# Patient Record
Sex: Female | Born: 1950
Health system: Southern US, Community
[De-identification: ages and names within clinical notes are randomized; demographics above are authoritative.]

## PROBLEM LIST (undated history)

## (undated) DIAGNOSIS — E049 Nontoxic goiter, unspecified: Secondary | ICD-10-CM

## (undated) DIAGNOSIS — I341 Nonrheumatic mitral (valve) prolapse: Secondary | ICD-10-CM

## (undated) DIAGNOSIS — I200 Unstable angina: Secondary | ICD-10-CM

## (undated) DIAGNOSIS — M542 Cervicalgia: Secondary | ICD-10-CM

## (undated) DIAGNOSIS — I2510 Atherosclerotic heart disease of native coronary artery without angina pectoris: Secondary | ICD-10-CM

## (undated) DIAGNOSIS — I10 Essential (primary) hypertension: Secondary | ICD-10-CM

## (undated) DIAGNOSIS — J45909 Unspecified asthma, uncomplicated: Secondary | ICD-10-CM

## (undated) DIAGNOSIS — Z860100 Personal history of colon polyps, unspecified: Secondary | ICD-10-CM

## (undated) DIAGNOSIS — E785 Hyperlipidemia, unspecified: Secondary | ICD-10-CM

## (undated) DIAGNOSIS — G43909 Migraine, unspecified, not intractable, without status migrainosus: Secondary | ICD-10-CM

## (undated) DIAGNOSIS — Z8679 Personal history of other diseases of the circulatory system: Secondary | ICD-10-CM

## (undated) DIAGNOSIS — E041 Nontoxic single thyroid nodule: Secondary | ICD-10-CM

## (undated) DIAGNOSIS — K219 Gastro-esophageal reflux disease without esophagitis: Secondary | ICD-10-CM

## (undated) DIAGNOSIS — G43009 Migraine without aura, not intractable, without status migrainosus: Secondary | ICD-10-CM

## (undated) DIAGNOSIS — I4510 Unspecified right bundle-branch block: Secondary | ICD-10-CM

## (undated) DIAGNOSIS — N63 Unspecified lump in unspecified breast: Secondary | ICD-10-CM

## (undated) HISTORY — DX: Nontoxic goiter, unspecified: E04.9

## (undated) HISTORY — DX: Migraine without aura, not intractable, without status migrainosus: G43.009

## (undated) HISTORY — DX: Essential (primary) hypertension: I10

## (undated) HISTORY — DX: Unspecified right bundle-branch block: I45.10

## (undated) HISTORY — DX: Hyperlipidemia, unspecified: E78.5

## (undated) HISTORY — DX: Atherosclerotic heart disease of native coronary artery without angina pectoris: I25.10

## (undated) HISTORY — DX: Nonrheumatic mitral (valve) prolapse: I34.1

## (undated) HISTORY — DX: Personal history of colonic polyps: Z86.010

## (undated) HISTORY — DX: Personal history of other diseases of the circulatory system: Z86.79

## (undated) HISTORY — DX: Personal history of colon polyps, unspecified: Z86.0100

## (undated) HISTORY — DX: Unspecified asthma, uncomplicated: J45.909

## (undated) HISTORY — DX: Unstable angina: I20.0

## (undated) HISTORY — DX: Cervicalgia: M54.2

---

## 1999-02-05 ENCOUNTER — Encounter: Payer: Self-pay | Admitting: Family Medicine

## 1999-02-05 ENCOUNTER — Encounter: Admission: RE | Admit: 1999-02-05 | Discharge: 1999-02-05 | Payer: Self-pay | Admitting: Family Medicine

## 1999-08-26 ENCOUNTER — Other Ambulatory Visit: Admission: RE | Admit: 1999-08-26 | Discharge: 1999-08-26 | Payer: Self-pay | Admitting: Obstetrics and Gynecology

## 2000-01-20 ENCOUNTER — Other Ambulatory Visit: Admission: RE | Admit: 2000-01-20 | Discharge: 2000-01-20 | Payer: Self-pay | Admitting: *Deleted

## 2000-10-26 ENCOUNTER — Other Ambulatory Visit: Admission: RE | Admit: 2000-10-26 | Discharge: 2000-10-26 | Payer: Self-pay | Admitting: Obstetrics and Gynecology

## 2000-11-01 ENCOUNTER — Encounter: Payer: Self-pay | Admitting: Family Medicine

## 2000-11-01 ENCOUNTER — Encounter: Admission: RE | Admit: 2000-11-01 | Discharge: 2000-11-01 | Payer: Self-pay | Admitting: Family Medicine

## 2000-11-16 ENCOUNTER — Encounter: Payer: Self-pay | Admitting: Endocrinology

## 2000-11-16 ENCOUNTER — Ambulatory Visit (HOSPITAL_COMMUNITY): Admission: RE | Admit: 2000-11-16 | Discharge: 2000-11-16 | Payer: Self-pay | Admitting: Endocrinology

## 2000-11-30 ENCOUNTER — Other Ambulatory Visit: Admission: RE | Admit: 2000-11-30 | Discharge: 2000-11-30 | Payer: Self-pay | Admitting: Endocrinology

## 2002-06-05 ENCOUNTER — Other Ambulatory Visit: Admission: RE | Admit: 2002-06-05 | Discharge: 2002-06-05 | Payer: Self-pay | Admitting: Obstetrics and Gynecology

## 2002-11-02 ENCOUNTER — Encounter: Payer: Self-pay | Admitting: Endocrinology

## 2002-11-02 ENCOUNTER — Ambulatory Visit (HOSPITAL_COMMUNITY): Admission: RE | Admit: 2002-11-02 | Discharge: 2002-11-02 | Payer: Self-pay | Admitting: Endocrinology

## 2003-08-13 ENCOUNTER — Encounter (INDEPENDENT_AMBULATORY_CARE_PROVIDER_SITE_OTHER): Payer: Self-pay | Admitting: *Deleted

## 2003-08-13 ENCOUNTER — Ambulatory Visit (HOSPITAL_COMMUNITY): Admission: RE | Admit: 2003-08-13 | Discharge: 2003-08-13 | Payer: Self-pay | Admitting: Endocrinology

## 2005-04-16 ENCOUNTER — Ambulatory Visit (HOSPITAL_COMMUNITY): Admission: RE | Admit: 2005-04-16 | Discharge: 2005-04-16 | Payer: Self-pay | Admitting: Endocrinology

## 2005-06-08 ENCOUNTER — Other Ambulatory Visit: Admission: RE | Admit: 2005-06-08 | Discharge: 2005-06-08 | Payer: Self-pay | Admitting: Obstetrics and Gynecology

## 2005-07-06 ENCOUNTER — Encounter: Payer: Self-pay | Admitting: Internal Medicine

## 2005-07-13 ENCOUNTER — Encounter: Payer: Self-pay | Admitting: Internal Medicine

## 2005-07-23 ENCOUNTER — Ambulatory Visit (HOSPITAL_COMMUNITY): Admission: RE | Admit: 2005-07-23 | Discharge: 2005-07-23 | Payer: Self-pay | Admitting: Endocrinology

## 2006-03-09 ENCOUNTER — Ambulatory Visit (HOSPITAL_COMMUNITY): Admission: RE | Admit: 2006-03-09 | Discharge: 2006-03-09 | Payer: Self-pay | Admitting: Endocrinology

## 2006-03-22 ENCOUNTER — Other Ambulatory Visit: Admission: RE | Admit: 2006-03-22 | Discharge: 2006-03-22 | Payer: Self-pay | Admitting: Interventional Radiology

## 2006-03-22 ENCOUNTER — Encounter: Admission: RE | Admit: 2006-03-22 | Discharge: 2006-03-22 | Payer: Self-pay | Admitting: Endocrinology

## 2006-03-22 ENCOUNTER — Encounter (INDEPENDENT_AMBULATORY_CARE_PROVIDER_SITE_OTHER): Payer: Self-pay | Admitting: Specialist

## 2006-09-21 ENCOUNTER — Ambulatory Visit (HOSPITAL_COMMUNITY): Admission: RE | Admit: 2006-09-21 | Discharge: 2006-09-21 | Payer: Self-pay | Admitting: Obstetrics and Gynecology

## 2007-01-07 ENCOUNTER — Encounter: Admission: RE | Admit: 2007-01-07 | Discharge: 2007-01-07 | Payer: Self-pay | Admitting: Family Medicine

## 2007-04-14 HISTORY — PX: HYSTEROSCOPY: SHX211

## 2007-12-14 ENCOUNTER — Ambulatory Visit (HOSPITAL_COMMUNITY): Admission: RE | Admit: 2007-12-14 | Discharge: 2007-12-14 | Payer: Self-pay | Admitting: Obstetrics and Gynecology

## 2007-12-14 ENCOUNTER — Encounter (INDEPENDENT_AMBULATORY_CARE_PROVIDER_SITE_OTHER): Payer: Self-pay | Admitting: Obstetrics and Gynecology

## 2008-04-13 LAB — HM COLONOSCOPY

## 2008-09-26 ENCOUNTER — Encounter: Admission: RE | Admit: 2008-09-26 | Discharge: 2008-09-26 | Payer: Self-pay | Admitting: Internal Medicine

## 2008-11-08 ENCOUNTER — Encounter: Admission: RE | Admit: 2008-11-08 | Discharge: 2008-11-08 | Payer: Self-pay | Admitting: Obstetrics and Gynecology

## 2009-05-28 ENCOUNTER — Emergency Department (HOSPITAL_COMMUNITY)
Admission: EM | Admit: 2009-05-28 | Discharge: 2009-05-28 | Payer: Self-pay | Source: Home / Self Care | Admitting: Emergency Medicine

## 2009-06-11 LAB — HM MAMMOGRAPHY: HM Mammogram: NORMAL

## 2009-06-11 LAB — CONVERTED CEMR LAB: Pap Smear: NORMAL

## 2009-07-16 ENCOUNTER — Ambulatory Visit: Payer: Self-pay | Admitting: Family Medicine

## 2009-08-06 ENCOUNTER — Ambulatory Visit: Payer: Self-pay | Admitting: Internal Medicine

## 2009-08-06 DIAGNOSIS — M542 Cervicalgia: Secondary | ICD-10-CM

## 2009-08-06 DIAGNOSIS — E049 Nontoxic goiter, unspecified: Secondary | ICD-10-CM | POA: Insufficient documentation

## 2009-08-06 DIAGNOSIS — Z8679 Personal history of other diseases of the circulatory system: Secondary | ICD-10-CM | POA: Insufficient documentation

## 2009-08-06 DIAGNOSIS — G43009 Migraine without aura, not intractable, without status migrainosus: Secondary | ICD-10-CM | POA: Insufficient documentation

## 2009-08-06 DIAGNOSIS — E785 Hyperlipidemia, unspecified: Secondary | ICD-10-CM | POA: Insufficient documentation

## 2009-08-06 HISTORY — DX: Migraine without aura, not intractable, without status migrainosus: G43.009

## 2009-08-06 HISTORY — DX: Hyperlipidemia, unspecified: E78.5

## 2009-08-06 HISTORY — DX: Cervicalgia: M54.2

## 2009-09-17 ENCOUNTER — Encounter: Admission: RE | Admit: 2009-09-17 | Discharge: 2009-09-17 | Payer: Self-pay | Admitting: Endocrinology

## 2009-10-02 ENCOUNTER — Encounter: Admission: RE | Admit: 2009-10-02 | Discharge: 2009-10-02 | Payer: Self-pay | Admitting: Endocrinology

## 2009-10-02 ENCOUNTER — Other Ambulatory Visit: Admission: RE | Admit: 2009-10-02 | Discharge: 2009-10-02 | Payer: Self-pay | Admitting: Interventional Radiology

## 2009-10-11 ENCOUNTER — Encounter: Admission: RE | Admit: 2009-10-11 | Discharge: 2009-10-11 | Payer: Self-pay | Admitting: Neurology

## 2009-11-11 ENCOUNTER — Encounter: Admission: RE | Admit: 2009-11-11 | Discharge: 2009-11-11 | Payer: Self-pay | Admitting: Obstetrics and Gynecology

## 2009-12-11 ENCOUNTER — Telehealth: Payer: Self-pay | Admitting: Internal Medicine

## 2010-02-06 ENCOUNTER — Encounter: Admission: RE | Admit: 2010-02-06 | Discharge: 2010-02-06 | Payer: Self-pay | Admitting: Neurology

## 2010-05-03 ENCOUNTER — Encounter: Payer: Self-pay | Admitting: Endocrinology

## 2010-05-04 ENCOUNTER — Encounter: Payer: Self-pay | Admitting: Endocrinology

## 2010-05-12 LAB — CBC
Hemoglobin: 14 g/dL (ref 12.0–15.0)
RBC: 4.55 MIL/uL (ref 3.87–5.11)
WBC: 3.4 10*3/uL — ABNORMAL LOW (ref 4.0–10.5)
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
## 2010-05-13 NOTE — Progress Notes (Signed)
Summary: old records are reviewed  Phone Note Outgoing Call   Summary of Call: records from previous M.D. reviewed TD 10-13-2005 History of chest pain, negative stress test in 2002, re-eval by Dr Fraser Din in 2007 --echocardiogram showed mild prolapse of the anterior mitral valve with trivial MR, normal EF. Normal stress test. Jose E. Paz MD  December 11, 2009 10:57 AM

## 2010-05-13 NOTE — Letter (Signed)
Summary: [facility]  [facility]   Imported By: Sherian Rein 12/20/2009 07:57:45  _____________________________________________________________________  External Attachment:    Type:   Image     Comment:   External Document

## 2010-05-15 ENCOUNTER — Ambulatory Visit (HOSPITAL_COMMUNITY)
Admission: RE | Admit: 2010-05-15 | Discharge: 2010-05-15 | Disposition: A | Discharge status: Home / Self Care | Payer: BC Managed Care – PPO | Source: Ambulatory Visit | Attending: Obstetrics and Gynecology | Admitting: Obstetrics and Gynecology

## 2010-05-15 ENCOUNTER — Other Ambulatory Visit: Payer: Self-pay | Admitting: Obstetrics and Gynecology

## 2010-05-15 DIAGNOSIS — N840 Polyp of corpus uteri: Secondary | ICD-10-CM | POA: Insufficient documentation

## 2010-05-15 DIAGNOSIS — D250 Submucous leiomyoma of uterus: Secondary | ICD-10-CM | POA: Insufficient documentation

## 2010-05-17 NOTE — H&P (Signed)
  NAME:  Christine Walker, Christine Walker NO.:  [id number]  MEDICAL RECORD NO.:  [id number]         PATIENT TYPE:  WAMB  LOCATION:                                FACILITY:  [facility]  PHYSICIAN:  Juluis Mire, M.D.   DATE OF BIRTH:  04/02/1951  DATE OF ADMISSION:  05/15/2010 DATE OF DISCHARGE:                             HISTORY & PHYSICAL   A 60 year old postmenopausal patient presents for hysteroscopy.  The patient had an episode of post-menopausal bleeding.  Underwent a saline infusion ultrasound with finding of several polypoid-like masses seen within the intrauterine cavity plus a subserosal fibroid who now presents for hysteroscopic resection of that.  She had a previous hysteroscopy D and C in 2009.  In terms of allergies, no known drug allergies.  MEDICATIONS:  She takes something for migraine headaches, she was unsure.  PAST MEDICAL HISTORY:  History of migraine headaches.  Otherwise usual childhood diseases.  No significant sequelae.  PAST SURGICAL HISTORY:  Basically was the hysteroscopy with D and C in 2009.  FAMILY HISTORY:  Noncontributory.  SOCIAL HISTORY:  No tobacco or alcohol abuse.  REVIEW OF SYSTEMS:  Noncontributory.  PHYSICAL EXAMINATION:  VITAL SIGNS:  The patient afebrile, stable vital signs. HEENT:  The patient is normocephalic.  Pupils equal, round, and reactive to light and accommodation.  Extraocular movements were intact.  Sclerae and conjunctivae were clear.  Oropharynx clear. NECK:  Without thyromegaly. BREASTS:  No discrete masses. LUNGS:  Clear. CARDIOVASCULAR:  Regular rate.  No murmurs or gallops. ABDOMEN:  Benign.  No mass, organomegaly, or tenderness. PELVIC:  Normal external genitalia.  Vaginal mucosa is clear.  Cervix unremarkable.  Usual size, shape, and contour.  Adnexa free of mass or tenderness. EXTREMITIES:  Trace edema. NEUROLOGIC:  Grossly within normal limits.  IMPRESSION:  Postmenopausal bleeding with finding of  endometrial polyp.  PLAN:  The patient is to undergo hysteroscopic resection.  The risks of surgery have been discussed including the risk of infection.  The risk of hemorrhage could require transfusion with AIDS or hepatitis. Excessive bleeding could require hysterectomy.  There is a risk of perforation leading to injury to adjacent organs, risk of deep venous thrombosis, and pulmonary embolus.  The patient voiced understanding of indications and risks.     Juluis Mire, M.D.     JSM/MEDQ  D:  05/15/2010  T:  05/15/2010  Job:  [id number]  Electronically Signed by Richardean Chimera M.D. on 05/16/2010 01:44:30 PM

## 2010-05-17 NOTE — Op Note (Signed)
  NAME:  Christine Walker, Christine Walker             ACCOUNT NO.:  [id number]  MEDICAL RECORD NO.:  [id number]           PATIENT TYPE:  O  LOCATION:  [facility]                          [facility]ACILITY:  [facility]  PHYSICIAN:  Juluis Mire, M.D.   DATE OF BIRTH:  02-08-1951  DATE OF PROCEDURE:  05/15/2010 DATE OF DISCHARGE:                              OPERATIVE REPORT   PREOPERATIVE DIAGNOSIS:  Endometrial polyp.  POSTOPERATIVE DIAGNOSIS:  Submucosal fibroid.  PROCEDURE:  Paracervical block.  Hysteroscopy, resection of submucosal fibroids.  Endometrial biopsies and curettings.  SURGEON:  Juluis Mire, M.D.  ANESTHESIA:  Paracervical block with general anesthesia.  ESTIMATED BLOOD LOSS:  Minimal.  PACKS AND DRAINS:  None.  INTRAOPERATIVE BLOOD PLACED:  None.  COMPLICATIONS:  A small fundal perforation.  It was difficult to visualize, but it did result in some excessive fluid accumulation, total of 600 mL deficit.  Visualization we could not really see the perforation.  We felt like it probably has occurred with one of the smaller dilators.  There was no signs that this occurred with resection or curetting.  PROCEDURE:  The patient taken to OR, and placed in supine position after satisfactory level of general anesthesia was obtained, the patient was placed in the dorsal lithotomy position using the Allen stirrups, did have PAS stockings in place.  The patient's perineum and vagina were prepped out with Betadine and draped sterile field.  Speculum was placed in the vaginal vault.  The cervix was grasped with single tooth tenaculum and the uterus sounded to 9 cm.  The cervix was serially dilated to a size 31 Pratt dilator.  Operative hysteroscope was introduced into intrauterine cavity distended using glycerol. Visualization revealed two small submucosal fibroids on the posterior wall.  These were resected and sent for pathological review.  We did multiple endometrial biopsies along with the curettings.   Again, we noticed an excessive deficit.  We sounded and she did evidently have a perforation up to the left fundus, tried to visualized with the hysteroscope, could never see it, but a small area, so we felt like it was a small perforation.  Total deficit was 600 mL.  At this point in time, a single-tooth tenaculum and speculum were then removed.  The patient taken out of dorsal position once alert, extubated, and transferred to recovery room in good condition.  Sponges, instrument, and needle count reported as correct by circulating nurse.     Juluis Mire, M.D.     JSM/MEDQ  D:  05/15/2010  T:  05/16/2010  Job:  [id number]  Electronically Signed by Richardean Chimera M.D. on 05/16/2010 01:44:34 PM

## 2010-08-26 NOTE — Op Note (Signed)
Christine Walker, Christine Walker             ACCOUNT NO.:  [id number]   MEDICAL RECORD NO.:  [id number]          PATIENT TYPE:  AMB   LOCATION:  [facility]                           [facility]ILITY:  [facility]   PHYSICIAN:  Juluis Mire, M.D.   DATE OF BIRTH:  1950-10-29   DATE OF PROCEDURE:  12/14/2007  DATE OF DISCHARGE:                               OPERATIVE REPORT   PREOPERATIVE DIAGNOSES:  1. Uterine fibroids.  2. Endometrial polyps.   POSTOPERATIVE DIAGNOSIS:  Probable intrauterine submucosal fibroids.   PROCEDURES:  1. Paracervical block.  2. Cervical dilatation.  3. Hysteroscopy.  4. Resection of apparent submucosal fibroids as well as endometrial      curettings.   SURGEON:  Juluis Mire, MD   ANESTHESIA:  General as well as paracervical block.   ESTIMATED BLOOD LOSS:  Minimal.   PACKS AND DRAINS:  None.   INTRAOPERATIVE BLOOD PLACED:  None.   COMPLICATIONS:  None.   INDICATIONS:  As noted in the history and physical.   PROCEDURE:  The patient was taken to the OR, placed in supine position.  After satisfactory level of general anesthesia was obtained, the patient  was placed in the dorsal lithotomy position using Allen stirrups.  The  perineum and vagina cleansed out with Betadine and draped in sterile  field.  A speculum was placed in vaginal vault.  The cervix was grasped  with single-tooth tenacula.  Paracervical block was instituted using  Nesacaine.  Uterus sounded approximately 12 cm.  Exam under anesthesia  revealed multiple uterine fibroids.  Cervix serially dilated with size  29 Pratt dilator.  Operative hysteroscope was introduced and  intrauterine cavity was distended using sorbitol.  Visualization  revealed posterior in the lower uterine segment, a submucosal fibroid,  and she had one more on the upper left side.  The vaginal mucosa,  otherwise, atrophic.  Both tubal ostia were unremarkable.  We did resect  both submucosal fibroids and these were sent for pathology.  We  also did  endometrial curettings.  There was no signs of  perforation or active bleeding.  Total deficit was 100 mL.  At this  point time, a single-tooth tenaculum and speculum were then removed.  The patient was taken out of the dorsal lithotomy position once alert,  extubated, and transferred to recovery room in good condition.  Sponge,  instrument, needle count was correct by circulating nurse x2.      Juluis Mire, M.D.  Electronically Signed     JSM/MEDQ  D:  12/14/2007  T:  12/15/2007  Job:  [id number]

## 2010-08-26 NOTE — H&P (Signed)
NAME:  Christine Walker, Christine Walker             ACCOUNT NO.:  [id number]   MEDICAL RECORD NO.:  [id number]          PATIENT TYPE:  AMB   LOCATION:  [facility]                           [facility]   PHYSICIAN:  Juluis Mire, M.D.   DATE OF BIRTH:  1950-10-14   DATE OF ADMISSION:  12/14/2007  DATE OF DISCHARGE:                              HISTORY & PHYSICAL   The patient is a 60 year old nulligravida postmenopausal patient  presents for hysteroscopy.   The patient was having trouble with postmenopausal bleeding, underwent  saline infusion ultrasound, did have an endometrial polyp noted on  ultrasound, and now presents for hysteroscopic resection.   In terms of allergies, the patient has no known drug allergies.   MEDICATIONS:  She is on thyroid replacement.   PAST MEDICAL HISTORY:  Usual childhood diseases.  Does have a history of  hypothyroidism followed by Dr. Arvilla Market.   No previous surgical or obstetrical history.   Family history is noncontributory.   Social history reveals no tobacco or alcohol use.   REVIEW OF SYSTEMS:  Noncontributory.   PHYSICAL EXAMINATION:  GENERAL:  The patient is afebrile.  VITAL SIGNS:  Stable vital signs.  HEENT:  The patient is normocephalic.  Pupils are equal, round, and  reactive to light and accommodation.  Extraocular movements are intact.  Sclerae and conjunctivae are clear.  Oropharynx is clear.  NECK:  Without thyromegaly.  BREASTS:  No discrete masses.  LUNGS:  Clear.  CARDIOVASCULAR:  Regular rate.  No murmurs, rubs, or gallops.  No  carotid bruits.  ABDOMEN:  Benign.  No mass, megaly, or tenderness.  PELVIC:  Normal external genitalia.  Vaginal mucosa is clear.  Cervix is  unremarkable.  Uterus is enlarged with multiple fibroids previous notes  about halfway through the umbilicus.  No significant change in size.  ADNEXA:  Unremarkable.  EXTREMITIES:  Trace edema.  NEUROLOGIC:  Grossly within normal limits.   IMPRESSION:  1. Uterine  fibroids, stable.  2. Postmenopausal bleeding with endometrial polyp.  3. Hypothyroidism.   PLAN:  We are going to set up hysteroscopy with D&C.  The risk of  procedure have been discussed including the risk of infection and the  risk of vascular injury could lead to hemorrhage requiring transfusion  with risk of AIDS or hepatitis.  Excessive bleeding could require  hysterectomy.  There is a risk of perforation leading to injury to  adjacent organs, requiring exploratory surgery, and risk of deep venous  thrombosis and pulmonary embolus.  The patient expressed understanding  the indications and risks.      Juluis Mire, M.D.  Electronically Signed     JSM/MEDQ  D:  12/14/2007  T:  12/15/2007  Job:  [id number]

## 2010-10-02 ENCOUNTER — Other Ambulatory Visit: Payer: Self-pay | Admitting: Endocrinology

## 2010-10-02 DIAGNOSIS — E049 Nontoxic goiter, unspecified: Secondary | ICD-10-CM

## 2010-10-03 ENCOUNTER — Ambulatory Visit
Admission: RE | Admit: 2010-10-03 | Discharge: 2010-10-03 | Disposition: A | Discharge status: Home / Self Care | Payer: BC Managed Care – PPO | Source: Ambulatory Visit | Attending: Endocrinology | Admitting: Endocrinology

## 2010-10-03 DIAGNOSIS — E049 Nontoxic goiter, unspecified: Secondary | ICD-10-CM

## 2010-10-14 ENCOUNTER — Encounter: Payer: Self-pay | Admitting: Internal Medicine

## 2010-10-14 ENCOUNTER — Ambulatory Visit (INDEPENDENT_AMBULATORY_CARE_PROVIDER_SITE_OTHER): Payer: BC Managed Care – PPO | Admitting: Internal Medicine

## 2010-10-14 VITALS — BP 126/82 | HR 73 | Wt 154.4 lb

## 2010-10-14 DIAGNOSIS — M545 Low back pain: Secondary | ICD-10-CM

## 2010-10-14 DIAGNOSIS — M549 Dorsalgia, unspecified: Secondary | ICD-10-CM | POA: Insufficient documentation

## 2010-10-14 LAB — POCT URINALYSIS DIPSTICK
Bilirubin, UA: NEGATIVE
Leukocytes, UA: NEGATIVE
Nitrite, UA: NEGATIVE
Protein, UA: NEGATIVE
Urobilinogen, UA: 0.2
pH, UA: 6.5

## 2010-10-14 NOTE — Assessment & Plan Note (Signed)
Right-sided low back pain for a month. She is already taking muscle relaxants and pain medicines for a different type of backache. Recommend rest, heating pads and icy-hot over-the-counter. Will let me know if not better soon. Urinalysis negative
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
## 2011-01-14 LAB — TSH: TSH: 1.757

## 2011-01-14 LAB — CBC
Hemoglobin: 14.1
RDW: 13.3
WBC: 4.1

## 2011-10-07 ENCOUNTER — Other Ambulatory Visit: Payer: Self-pay | Admitting: Endocrinology

## 2011-10-07 DIAGNOSIS — E049 Nontoxic goiter, unspecified: Secondary | ICD-10-CM

## 2011-10-08 ENCOUNTER — Ambulatory Visit
Admission: RE | Admit: 2011-10-08 | Discharge: 2011-10-08 | Disposition: A | Discharge status: Home / Self Care | Payer: BC Managed Care – PPO | Source: Ambulatory Visit | Attending: Endocrinology | Admitting: Endocrinology

## 2011-10-08 DIAGNOSIS — E049 Nontoxic goiter, unspecified: Secondary | ICD-10-CM

## 2011-10-29 ENCOUNTER — Ambulatory Visit: Payer: BC Managed Care – PPO | Admitting: Cardiovascular Disease

## 2011-11-04 ENCOUNTER — Ambulatory Visit: Payer: BC Managed Care – PPO | Admitting: Cardiovascular Disease

## 2011-11-11 ENCOUNTER — Encounter: Payer: Self-pay | Admitting: Cardiovascular Disease

## 2011-11-11 ENCOUNTER — Ambulatory Visit (INDEPENDENT_AMBULATORY_CARE_PROVIDER_SITE_OTHER): Payer: BC Managed Care – PPO | Admitting: Cardiovascular Disease

## 2011-11-11 VITALS — BP 122/76 | HR 66 | Ht 67.0 in | Wt 154.0 lb

## 2011-11-11 DIAGNOSIS — R0602 Shortness of breath: Secondary | ICD-10-CM

## 2011-11-11 DIAGNOSIS — R079 Chest pain, unspecified: Secondary | ICD-10-CM

## 2011-11-11 DIAGNOSIS — E785 Hyperlipidemia, unspecified: Secondary | ICD-10-CM

## 2011-11-11 DIAGNOSIS — R072 Precordial pain: Secondary | ICD-10-CM

## 2011-11-11 NOTE — Patient Instructions (Addendum)
Your physician has requested that you have a stress echocardiogram. For further information please visit [email or web address]. Please follow instruction sheet as given.  Your physician recommends that you schedule a follow-up appointment as needed.  Your physician recommends that you continue on your current medications as directed. Please refer to the Current Medication list given to you today.  

## 2011-11-12 ENCOUNTER — Encounter: Payer: Self-pay | Admitting: Cardiovascular Disease

## 2011-11-12 NOTE — Assessment & Plan Note (Signed)
The patient has fairly typical symptoms of angina. Her pretest probability of obstructive CAD is moderate considering her family history is a major risk factor. I have recommended a stress echocardiogram to rule out ischemic heart disease. She understands if she develops severe chest pain or resting symptoms, she needs to call 911. Will determine followup after her stress echo is completed. Her lipids have been followed by her primary care physician.
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
## 2011-11-12 NOTE — Assessment & Plan Note (Signed)
Lipids were reviewed and are total cholesterol is 224, HDL 63, and LDL 144. Triglycerides were 87. A trial of lifestyle modification is reasonable in this patient without coronary heart disease. Certainly, if her stress echocardiogram is abnormal or if she is diagnosed with CAD, she will require pharmacotherapy with a statin drug.

## 2011-11-17 ENCOUNTER — Other Ambulatory Visit (HOSPITAL_COMMUNITY): Payer: BC Managed Care – PPO

## 2011-11-20 ENCOUNTER — Ambulatory Visit (HOSPITAL_COMMUNITY): Payer: BC Managed Care – PPO | Attending: Cardiology | Admitting: Radiology

## 2011-11-20 ENCOUNTER — Ambulatory Visit (HOSPITAL_COMMUNITY): Payer: BC Managed Care – PPO | Attending: Cardiology

## 2011-11-20 ENCOUNTER — Encounter: Payer: Self-pay | Admitting: Cardiovascular Disease

## 2011-11-20 DIAGNOSIS — R0602 Shortness of breath: Secondary | ICD-10-CM

## 2011-11-20 DIAGNOSIS — R072 Precordial pain: Secondary | ICD-10-CM

## 2011-11-20 DIAGNOSIS — R079 Chest pain, unspecified: Secondary | ICD-10-CM

## 2011-11-20 DIAGNOSIS — R0989 Other specified symptoms and signs involving the circulatory and respiratory systems: Secondary | ICD-10-CM

## 2011-11-20 NOTE — Progress Notes (Signed)
Stress Echocardiogram performed.  

## 2011-11-30 NOTE — Addendum Note (Signed)
Addended by: Burnett Kanaris A on: 11/30/2011 02:15 PM   Modules accepted: Orders

## 2012-01-22 ENCOUNTER — Ambulatory Visit (INDEPENDENT_AMBULATORY_CARE_PROVIDER_SITE_OTHER): Payer: BC Managed Care – PPO | Admitting: Family Medicine

## 2012-01-22 ENCOUNTER — Encounter: Payer: Self-pay | Admitting: Family Medicine

## 2012-01-22 VITALS — BP 105/70 | HR 69 | Temp 98.1°F | Ht 67.0 in | Wt 158.8 lb

## 2012-01-22 DIAGNOSIS — R0789 Other chest pain: Secondary | ICD-10-CM

## 2012-01-22 DIAGNOSIS — K3189 Other diseases of stomach and duodenum: Secondary | ICD-10-CM

## 2012-01-22 DIAGNOSIS — R1013 Epigastric pain: Secondary | ICD-10-CM

## 2012-01-22 MED ORDER — DEXLANSOPRAZOLE 60 MG PO CPDR: 60.0000 mg | DELAYED_RELEASE_CAPSULE | Freq: Every day | ORAL | Status: DC

## 2012-01-22 NOTE — Patient Instructions (Signed)
Chest Pain (Nonspecific) It is often hard to give a specific diagnosis for the cause of chest pain. There is always a chance that your pain could be related to something serious, such as a heart attack or a blood clot in the lungs. You need to follow up with your caregiver for further evaluation. CAUSES   Heartburn.  Pneumonia or bronchitis.  Anxiety or stress.  Inflammation around your heart (pericarditis) or lung (pleuritis or pleurisy).  A blood clot in the lung.  A collapsed lung (pneumothorax). It can develop suddenly on its own (spontaneous pneumothorax) or from injury (trauma) to the chest.  Shingles infection (herpes zoster virus). The chest wall is composed of bones, muscles, and cartilage. Any of these can be the source of the pain.  The bones can be bruised by injury.  The muscles or cartilage can be strained by coughing or overwork.  The cartilage can be affected by inflammation and become sore (costochondritis). DIAGNOSIS  Lab tests or other studies, such as X-rays, electrocardiography, stress testing, or cardiac imaging, may be needed to find the cause of your pain.  TREATMENT   Treatment depends on what may be causing your chest pain. Treatment may include:  Acid blockers for heartburn.  Anti-inflammatory medicine.  Pain medicine for inflammatory conditions.  Antibiotics if an infection is present.  You may be advised to change lifestyle habits. This includes stopping smoking and avoiding alcohol, caffeine, and chocolate.  You may be advised to keep your head raised (elevated) when sleeping. This reduces the chance of acid going backward from your stomach into your esophagus.  Most of the time, nonspecific chest pain will improve within 2 to 3 days with rest and mild pain medicine. HOME CARE INSTRUCTIONS   If antibiotics were prescribed, take your antibiotics as directed. Finish them even if you start to feel better.  For the next few days, avoid physical  activities that bring on chest pain. Continue physical activities as directed.  Do not smoke.  Avoid drinking alcohol.  Only take over-the-counter or prescription medicine for pain, discomfort, or fever as directed by your caregiver.  Follow your caregiver's suggestions for further testing if your chest pain does not go away.  Keep any follow-up appointments you made. If you do not go to an appointment, you could develop lasting (chronic) problems with pain. If there is any problem keeping an appointment, you must call to reschedule. SEEK MEDICAL CARE IF:   You think you are having problems from the medicine you are taking. Read your medicine instructions carefully.  Your chest pain does not go away, even after treatment.  You develop a rash with blisters on your chest. SEEK IMMEDIATE MEDICAL CARE IF:   You have increased chest pain or pain that spreads to your arm, neck, jaw, back, or abdomen.  You develop shortness of breath, an increasing cough, or you are coughing up blood.  You have severe back or abdominal pain, feel nauseous, or vomit.  You develop severe weakness, fainting, or chills.  You have a fever. THIS IS AN EMERGENCY. Do not wait to see if the pain will go away. Get medical help at once. Call your local emergency services (911 in [place] Do not drive yourself to the hospital. MAKE SURE YOU:   Understand these instructions.  Will watch your condition.  Will get help right away if you are not doing well or get worse. Document Released: 01/07/2005 Document Revised: 06/22/2011 Document Reviewed: 11/03/2007 [employer] Patient Information 2013 [employer].  

## 2012-01-22 NOTE — Progress Notes (Signed)
  Subjective:    Christine Walker is a 61 y.o. female who presents for evaluation of chest pain. Onset was 2 years ago. Symptoms have been unchanged since that time. The patient describes the pain as pressure and does not radiate. Patient rates pain as a 4/10 in intensity. Associated symptoms are: chest pressure/discomfort, exertional chest pressure/discomfort and fatigue. Aggravating factors are: exercise. Alleviating factors are: rest. Patient's cardiac risk factors are: none. Patient's risk factors for DVT/PE: none. Previous cardiac testing: stress echol.  The following portions of the patient's history were reviewed and updated as appropriate: allergies, current medications, past family history, past medical history, past social history, past surgical history and problem list.  Review of Systems Pertinent items are noted in HPI.    Objective:    BP 105/70  Pulse 69  Temp 98.1 F (36.7 C) (Oral)  Ht 5\' 7"  (1.702 m)  Wt 158 lb 12.8 oz (72.031 kg)  BMI 24.87 kg/m2  SpO2 98% General appearance: alert, cooperative, appears stated age and no distress Neck: no adenopathy, no JVD, supple, symmetrical, trachea midline and thyroid not enlarged, symmetric, no tenderness/mass/nodules Lungs: clear to auscultation bilaterally Heart: regular rate and rhythm, S1, S2 normal, no murmur, click, rub or gallop  Cardiographics ECG: normal sinus rhythm, no blocks or conduction defects, no ischemic changes  Imaging Chest x-ray: not available for review    Assessment:    Chest pain, suspected etiology: ?    Plan:    cardiac w/u neg  Gi cocktail given-- no relief but pt states she does feel like she has to burp Will check labs Refer to GI

## 2012-01-25 ENCOUNTER — Other Ambulatory Visit (INDEPENDENT_AMBULATORY_CARE_PROVIDER_SITE_OTHER): Payer: BC Managed Care – PPO

## 2012-01-25 DIAGNOSIS — R0789 Other chest pain: Secondary | ICD-10-CM

## 2012-01-25 LAB — BASIC METABOLIC PANEL
CO2: 30 mEq/L (ref 19–32)
Calcium: 10 mg/dL (ref 8.4–10.5)
GFR: 87.93 mL/min (ref >60.00)
Sodium: 145 mEq/L (ref 135–145)

## 2012-01-25 LAB — CBC WITH DIFFERENTIAL/PLATELET
Basophils Relative: 0.7 % (ref 0.0–3.0)
Hemoglobin: 14 g/dL (ref 12.0–15.0)
Lymphocytes Relative: 42.3 % (ref 12.0–46.0)
MCHC: 33.3 g/dL (ref 30.0–36.0)
Monocytes Relative: 6.6 % (ref 3.0–12.0)
Neutro Abs: 1.7 10*3/uL (ref 1.4–7.7)
RBC: 4.47 Mil/uL (ref 3.87–5.11)

## 2012-01-25 LAB — HEPATIC FUNCTION PANEL
ALT: 13 U/L (ref 0–35)
Alkaline Phosphatase: 57 U/L (ref 39–117)
Bilirubin, Direct: 0.1 mg/dL (ref 0.0–0.3)
Total Protein: 7.4 g/dL (ref 6.0–8.3)

## 2012-01-27 ENCOUNTER — Encounter: Payer: Self-pay | Admitting: Internal Medicine

## 2012-02-01 ENCOUNTER — Ambulatory Visit
Admission: RE | Admit: 2012-02-01 | Discharge: 2012-02-01 | Disposition: A | Discharge status: Home / Self Care | Payer: BC Managed Care – PPO | Source: Ambulatory Visit | Attending: Family Medicine | Admitting: Family Medicine

## 2012-02-01 DIAGNOSIS — R0789 Other chest pain: Secondary | ICD-10-CM

## 2012-02-04 ENCOUNTER — Telehealth: Payer: Self-pay

## 2012-02-04 DIAGNOSIS — E010 Iodine-deficiency related diffuse (endemic) goiter: Secondary | ICD-10-CM

## 2012-02-04 NOTE — Telephone Encounter (Signed)
Message copied by Arnette Norris on Thu Feb 04, 2012  4:32 PM ------      Message from: Sheliah Hatch      Created: Wed Feb 03, 2012 11:36 AM       Lungs are clear      Pt w/ possible enlargement of R lobe of thyroid- will need thyroid US (dx- thyromegaly)

## 2012-02-04 NOTE — Telephone Encounter (Signed)
msg left to call the office     KP 

## 2012-02-05 ENCOUNTER — Telehealth: Payer: Self-pay

## 2012-02-05 NOTE — Telephone Encounter (Signed)
Pt is scheduled for 1.16.14 at 2pm

## 2012-02-05 NOTE — Telephone Encounter (Signed)
Spoke with pt concerning labs. I advised pt all labs were neg. Pt stated understanding. Transferred call to [facility] pt wanted to schedule a physical.     MW

## 2012-02-05 NOTE — Telephone Encounter (Signed)
msg left to call the office     KP 

## 2012-02-09 NOTE — Telephone Encounter (Signed)
Discussed with patient and she voiced understanding.      KP 

## 2012-02-10 ENCOUNTER — Other Ambulatory Visit: Payer: Self-pay | Admitting: Family Medicine

## 2012-02-10 ENCOUNTER — Ambulatory Visit
Admission: RE | Admit: 2012-02-10 | Discharge: 2012-02-10 | Disposition: A | Discharge status: Home / Self Care | Payer: BC Managed Care – PPO | Source: Ambulatory Visit | Attending: Family Medicine | Admitting: Family Medicine

## 2012-02-10 DIAGNOSIS — E041 Nontoxic single thyroid nodule: Secondary | ICD-10-CM

## 2012-02-10 DIAGNOSIS — E010 Iodine-deficiency related diffuse (endemic) goiter: Secondary | ICD-10-CM

## 2012-02-17 ENCOUNTER — Encounter: Payer: Self-pay | Admitting: Internal Medicine

## 2012-02-18 ENCOUNTER — Encounter: Payer: Self-pay | Admitting: Internal Medicine

## 2012-02-18 ENCOUNTER — Ambulatory Visit (INDEPENDENT_AMBULATORY_CARE_PROVIDER_SITE_OTHER): Payer: BC Managed Care – PPO | Admitting: Internal Medicine

## 2012-02-18 VITALS — BP 98/66 | HR 76 | Ht 67.0 in | Wt 156.4 lb

## 2012-02-18 DIAGNOSIS — R1013 Epigastric pain: Secondary | ICD-10-CM

## 2012-02-18 DIAGNOSIS — R0789 Other chest pain: Secondary | ICD-10-CM

## 2012-02-18 DIAGNOSIS — K3189 Other diseases of stomach and duodenum: Secondary | ICD-10-CM
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
## 2012-02-18 NOTE — Patient Instructions (Addendum)
You have been scheduled for an endoscopy with propofol. Please follow written instructions given to you at your visit today. If you use inhalers (even only as needed) or a CPAP machine, please bring them with you on the day of your procedure. 

## 2012-02-22 ENCOUNTER — Encounter: Payer: Self-pay | Admitting: Internal Medicine

## 2012-02-22 ENCOUNTER — Ambulatory Visit (AMBULATORY_SURGERY_CENTER): Payer: BC Managed Care – PPO | Admitting: Internal Medicine

## 2012-02-22 VITALS — BP 143/104 | HR 75 | Temp 97.9°F | Resp 17 | Ht 67.0 in | Wt 156.0 lb

## 2012-02-22 DIAGNOSIS — K3189 Other diseases of stomach and duodenum: Secondary | ICD-10-CM

## 2012-02-22 DIAGNOSIS — D131 Benign neoplasm of stomach: Secondary | ICD-10-CM

## 2012-02-22 DIAGNOSIS — R079 Chest pain, unspecified: Secondary | ICD-10-CM

## 2012-02-22 DIAGNOSIS — R1013 Epigastric pain: Secondary | ICD-10-CM

## 2012-02-22 MED ORDER — SODIUM CHLORIDE 0.9 % IV SOLN: 500.0000 mL | INTRAVENOUS | Status: DC

## 2012-02-22 MED ORDER — PANTOPRAZOLE SODIUM 40 MG PO TBEC: 40.0000 mg | DELAYED_RELEASE_TABLET | Freq: Every day | ORAL | Status: DC

## 2012-02-22 NOTE — Progress Notes (Signed)
Patient did not have preoperative order for IV antibiotic SSI prophylaxis. (G8918)  Patient did not experience any of the following events: a burn prior to discharge; a fall within the facility; wrong site/side/patient/procedure/implant event; or a hospital transfer or hospital admission upon discharge from the facility. (G8907)  

## 2012-02-22 NOTE — Op Note (Signed)
[facility] [address] [place], [address]   ENDOSCOPY PROCEDURE REPORT  PATIENT: Christine, Walker  MR#: [id number] BIRTHDATE: 11-15-1950 , 61  yrs. old GENDER: Female ENDOSCOPIST: Beverley Fiedler, MD REFERRED BY:  Loreen Freud PROCEDURE DATE:  02/22/2012 PROCEDURE:  EGD w/ biopsy ASA CLASS:     Class II INDICATIONS:  atypical chest pain. MEDICATIONS: MAC sedation, administered by CRNA and propofol (Diprivan) 300mg  IV TOPICAL ANESTHETIC: Cetacaine Spray  DESCRIPTION OF PROCEDURE: After the risks benefits and alternatives of the procedure were thoroughly explained, informed consent was obtained.  The [facility] [id number] endoscope was introduced through the mouth and advanced to the second portion of the duodenum. Without limitations.  The instrument was slowly withdrawn as the mucosa was fully examined.     ESOPHAGUS: The mucosa of the esophagus appeared normal.  STOMACH: Two small non-bleeding shallow and clean-based ulcers were found in the gastric antrum.  Biopsies were taken at edge of the ulcers.   Mild gastritis (inflammation) was found in the gastric antrum.  Biopsies were taken in the antrum and angularis.  DUODENUM: The duodenal mucosa showed no abnormalities in the bulb and second portion of the duodenum. Retroflexed views revealed no abnormalities.     The scope was then withdrawn from the patient and the procedure completed.  COMPLICATIONS: There were no complications. ENDOSCOPIC IMPRESSION: 1.   The mucosa of the esophagus appeared normal 2.   Two small non-bleeding ulcers were found in the gastric antrum 3.   Gastritis (inflammation) was found in the gastric antrum; biopsies were taken in the antrum and angularis 4.   The duodenal mucosa showed no abnormalities in the bulb and second portion of the duodenum  RECOMMENDATIONS: 1.  Await pathology results 2.  Start pantoprazole 40 mg once daily (30 minutes to 1 hour before your first  meal of the day) for acid suppression 3.  Follow-up of helicobacter pylori status, treat if indicated 4.  Avoid NSAIDs  eSigned:  Beverley Fiedler, MD 02/22/2012 4:24 PM      XB:JYNWGN R Laury Axon, DO and The Patient

## 2012-02-22 NOTE — Patient Instructions (Addendum)

## 2012-02-23 ENCOUNTER — Telehealth: Payer: Self-pay

## 2012-02-23 NOTE — Telephone Encounter (Signed)
  Follow up Call-  Call back number 02/22/2012  Post procedure Call Back phone  # [phone] cell  Permission to leave phone message Yes     Patient questions:  Do you have a fever, pain , or abdominal swelling? no Pain Score  0 *  Have you tolerated food without any problems? yes  Have you been able to return to your normal activities? yes  Do you have any questions about your discharge instructions: Diet   no Medications  no Follow up visit  no  Do you have questions or concerns about your Care? yes My nose is burning.  RN advised saline nasal drops (may be related to drying by O2 [place]).  If no improvement or worsening symptoms please call primary care provider. Actions: * If pain score is 4 or above: No action needed, pain <4.

## 2012-03-01 ENCOUNTER — Encounter: Payer: Self-pay | Admitting: Internal Medicine

## 2012-03-13 HISTORY — PX: ESOPHAGOGASTRODUODENOSCOPY: SHX1529

## 2012-03-23 ENCOUNTER — Encounter: Payer: Self-pay | Admitting: Internal Medicine

## 2012-03-25 ENCOUNTER — Ambulatory Visit: Payer: BC Managed Care – PPO | Admitting: Internal Medicine

## 2012-03-31 ENCOUNTER — Encounter: Payer: Self-pay | Admitting: Internal Medicine

## 2012-04-01 ENCOUNTER — Encounter: Payer: Self-pay | Admitting: Internal Medicine

## 2012-04-01 ENCOUNTER — Ambulatory Visit (INDEPENDENT_AMBULATORY_CARE_PROVIDER_SITE_OTHER): Payer: BC Managed Care – PPO | Admitting: Internal Medicine

## 2012-04-01 VITALS — BP 134/68 | HR 78 | Ht 67.0 in | Wt 158.0 lb

## 2012-04-01 DIAGNOSIS — R0789 Other chest pain: Secondary | ICD-10-CM

## 2012-04-01 DIAGNOSIS — K219 Gastro-esophageal reflux disease without esophagitis: Secondary | ICD-10-CM

## 2012-04-01 DIAGNOSIS — K279 Peptic ulcer, site unspecified, unspecified as acute or chronic, without hemorrhage or perforation: Secondary | ICD-10-CM

## 2012-04-01 MED ORDER — PANTOPRAZOLE SODIUM 40 MG PO TBEC: 40.0000 mg | DELAYED_RELEASE_TABLET | Freq: Every day | ORAL | Status: DC

## 2012-04-01 NOTE — Progress Notes (Signed)
  Subjective:    Patient ID: Christine Walker, female    DOB: [birth year], 61 y.o.   MRN: [id number]  HPI Christine Walker is a 61 year old female whose last seen in clinic for evaluation of atypical chest pain who came for endoscopy on 02/22/2012. EGD revealed 2 small nonbleeding gastric antral ulcers with mild gastritis. Esophagus and examined portion of the duodenum were normal. Biopsies were negative for H. pylori, dysplasia or metaplasia. She was started on pantoprazole 40 mg daily. She noted with pantoprazole much improvement in her atypical chest discomfort.  She now feels that this was a GERD manifestation. She's had no bowel pain, nausea or vomiting. She's eating well. No evidence of bleeding.   Review of Systems As per history of present illness, otherwise negative  Current Medications, Allergies, Past Medical History, Past Surgical History, Family History and Social History were reviewed in [employer] record.     Objective:   Physical Exam BP 134/68  Pulse 78  Ht 5\' 7"  (1.702 m)  Wt 158 lb (71.668 kg)  BMI 24.75 kg/m2 Constitutional: Well-developed and well-nourished. No distress. HEENT: Normocephalic and atraumatic.Conjunctivae are normal.  No scleral icterus. Cardiovascular: Normal rate, regular rhythm and intact distal pulses. No M/R/G Pulmonary/chest: Effort normal and breath sounds normal. No wheezing, rales or rhonchi. Abdominal: Soft, nontender, nondistended. Bowel sounds active throughout. There are no masses palpable. No hepatosplenomegaly. Extremities: no clubbing, cyanosis, or edema Neurological: Alert and oriented to person place and time. Skin: Skin is warm and dry. No rashes noted. Psychiatric: Normal mood and affect. Behavior is normal.     Assessment & Plan:  61 year old female with a history of atypical chest pain felt secondary to reflux disease with gastric ulcers seen for followup  1.  Atypical CP/GERD -- improved with PPI therapy.  She  would prefer a non-medication approach to GERD management. We discussed GERD hygiene including not eating late at night, trying to avoid lying down within 2 hours of eating, avoiding foods such as acidic fruit juices, alcohol, caffeine, carbonated beverages, chocolate and peppermint. We will discontinue pantoprazole after she has completed 12 weeks. If her symptoms return and become troublesome, she is asked to notify us and she voices understanding.  2.  PUD -- H. pylori negative without significant formation. This may have been medication or NSAID-induced.  I recommended 12 weeks of PPI therapy, and we will discontinue thereafter. See #1  She can return as needed

## 2012-04-01 NOTE — Patient Instructions (Addendum)
Take protonix for the next 3 months. And follow a GERD diet.  Follow up with Dr. Rhea Belton as needed  Diet for Gastroesophageal Reflux Disease, Adult Reflux (acid reflux) is when acid from your stomach flows up into the esophagus. When acid comes in contact with the esophagus, the acid causes irritation and soreness (inflammation) in the esophagus. When reflux happens often or so severely that it causes damage to the esophagus, it is called gastroesophageal reflux disease (GERD). Nutrition therapy can help ease the discomfort of GERD. FOODS OR DRINKS TO AVOID OR LIMIT  Smoking or chewing tobacco. Nicotine is one of the most potent stimulants to acid production in the gastrointestinal tract.  Caffeinated and decaffeinated coffee and black tea.  Regular or low-calorie carbonated beverages or energy drinks (caffeine-free carbonated beverages are allowed).   Strong spices, such as black pepper, white pepper, red pepper, cayenne, curry powder, and chili powder.  Peppermint or spearmint.  Chocolate.  High-fat foods, including meats and fried foods. Extra added fats including oils, butter, salad dressings, and nuts. Limit these to less than 8 tsp per day.  Fruits and vegetables if they are not tolerated, such as citrus fruits or tomatoes.  Alcohol.  Any food that seems to aggravate your condition. If you have questions regarding your diet, call your caregiver or a registered dietitian. OTHER THINGS THAT MAY HELP GERD INCLUDE:   Eating your meals slowly, in a relaxed setting.  Eating 5 to 6 small meals per day instead of 3 large meals.  Eliminating food for a period of time if it causes distress.  Not lying down until 3 hours after eating a meal.  Keeping the head of your bed raised 6 to 9 inches (15 to 23 cm) by using a foam wedge or blocks under the legs of the bed. Lying flat may make symptoms worse.  Being physically active. Weight loss may be helpful in reducing reflux in  overweight or obese adults.  Wear loose fitting clothing EXAMPLE MEAL PLAN This meal plan is approximately 2,000 calories based on https://www.[facility]nard.org/ meal planning guidelines. Breakfast   cup cooked oatmeal.  1 cup strawberries.  1 cup low-fat milk.  1 oz almonds. Snack  1 cup cucumber slices.  6 oz yogurt (made from low-fat or fat-free milk). Lunch  2 slice whole-wheat bread.  2 oz sliced [place].  2 tsp mayonnaise.  1 cup blueberries.  1 cup snap peas. Snack  6 whole-wheat crackers.  1 oz string cheese. Dinner   cup brown rice.  1 cup mixed veggies.  1 tsp olive oil.  3 oz grilled fish. Document Released: 03/30/2005 Document Revised: 06/22/2011 Document Reviewed: 02/13/2011 [facility] Patient Information 2013 [place], [place].

## 2012-04-28 ENCOUNTER — Other Ambulatory Visit (HOSPITAL_COMMUNITY)
Admission: RE | Admit: 2012-04-28 | Discharge: 2012-04-28 | Disposition: A | Discharge status: Home / Self Care | Payer: 59 | Source: Ambulatory Visit | Attending: Family Medicine | Admitting: Family Medicine

## 2012-04-28 ENCOUNTER — Ambulatory Visit (INDEPENDENT_AMBULATORY_CARE_PROVIDER_SITE_OTHER): Payer: BC Managed Care – PPO | Admitting: Family Medicine

## 2012-04-28 ENCOUNTER — Encounter: Payer: Self-pay | Admitting: Family Medicine

## 2012-04-28 VITALS — BP 122/82 | HR 78 | Temp 97.9°F | Ht 67.0 in | Wt 157.2 lb

## 2012-04-28 DIAGNOSIS — Z1231 Encounter for screening mammogram for malignant neoplasm of breast: Secondary | ICD-10-CM

## 2012-04-28 DIAGNOSIS — Z01419 Encounter for gynecological examination (general) (routine) without abnormal findings: Secondary | ICD-10-CM | POA: Insufficient documentation

## 2012-04-28 DIAGNOSIS — Z1239 Encounter for other screening for malignant neoplasm of breast: Secondary | ICD-10-CM

## 2012-04-28 DIAGNOSIS — Z0000 Encounter for general adult medical examination without abnormal findings: Secondary | ICD-10-CM

## 2012-04-28 DIAGNOSIS — E785 Hyperlipidemia, unspecified: Secondary | ICD-10-CM

## 2012-04-28 NOTE — Patient Instructions (Addendum)
Preventive Care for Adults, Female A healthy lifestyle and preventive care can promote health and wellness. Preventive health guidelines for women include the following key practices.  A routine yearly physical is a good way to check with your caregiver about your health and preventive screening. It is a chance to share any concerns and updates on your health, and to receive a thorough exam.  Visit your dentist for a routine exam and preventive care every 6 months. Brush your teeth twice a day and floss once a day. Good oral hygiene prevents tooth decay and gum disease.  The frequency of eye exams is based on your age, health, family medical history, use of contact lenses, and other factors. Follow your caregiver's recommendations for frequency of eye exams.  Eat a healthy diet. Foods like vegetables, fruits, whole grains, low-fat dairy products, and lean protein foods contain the nutrients you need without too many calories. Decrease your intake of foods high in solid fats, added sugars, and salt. Eat the right amount of calories for you.Get information about a proper diet from your caregiver, if necessary.  Regular physical exercise is one of the most important things you can do for your health. Most adults should get at least 150 minutes of moderate-intensity exercise (any activity that increases your heart rate and causes you to sweat) each week. In addition, most adults need muscle-strengthening exercises on 2 or more days a week.  Maintain a healthy weight. The body mass index (BMI) is a screening tool to identify possible weight problems. It provides an estimate of body fat based on height and weight. Your caregiver can help determine your BMI, and can help you achieve or maintain a healthy weight.For adults 20 years and older:  A BMI below 18.5 is considered underweight.  A BMI of 18.5 to 24.9 is normal.  A BMI of 25 to 29.9 is considered overweight.  A BMI of 30 and above is  considered obese.  Maintain normal blood lipids and cholesterol levels by exercising and minimizing your intake of saturated fat. Eat a balanced diet with plenty of fruit and vegetables. Blood tests for lipids and cholesterol should begin at age 20 and be repeated every 5 years. If your lipid or cholesterol levels are high, you are over 50, or you are at high risk for heart disease, you may need your cholesterol levels checked more frequently.Ongoing high lipid and cholesterol levels should be treated with medicines if diet and exercise are not effective.  If you smoke, find out from your caregiver how to quit. If you do not use tobacco, do not start.  If you are pregnant, do not drink alcohol. If you are breastfeeding, be very cautious about drinking alcohol. If you are not pregnant and choose to drink alcohol, do not exceed 1 drink per day. One drink is considered to be 12 ounces (355 mL) of beer, 5 ounces (148 mL) of wine, or 1.5 ounces (44 mL) of liquor.  Avoid use of street drugs. Do not share needles with anyone. Ask for help if you need support or instructions about stopping the use of drugs.  High blood pressure causes heart disease and increases the risk of stroke. Your blood pressure should be checked at least every 1 to 2 years. Ongoing high blood pressure should be treated with medicines if weight loss and exercise are not effective.  If you are 55 to 60 years old, ask your caregiver if you should take aspirin to prevent strokes.  Diabetes   screening involves taking a blood sample to check your fasting blood sugar level. This should be done once every 3 years, after age 45, if you are within normal weight and without risk factors for diabetes. Testing should be considered at a younger age or be carried out more frequently if you are overweight and have at least 1 risk factor for diabetes.  Breast cancer screening is essential preventive care for women. You should practice "breast  self-awareness." This means understanding the normal appearance and feel of your breasts and may include breast self-examination. Any changes detected, no matter how small, should be reported to a caregiver. Women in their 20s and 30s should have a clinical breast exam (CBE) by a caregiver as part of a regular health exam every 1 to 3 years. After age 40, women should have a CBE every year. Starting at age 40, women should consider having a mammography (breast X-ray test) every year. Women who have a family history of breast cancer should talk to their caregiver about genetic screening. Women at a high risk of breast cancer should talk to their caregivers about having magnetic resonance imaging (MRI) and a mammography every year.  The Pap test is a screening test for cervical cancer. A Pap test can show cell changes on the cervix that might become cervical cancer if left untreated. A Pap test is a procedure in which cells are obtained and examined from the lower end of the uterus (cervix).  Women should have a Pap test starting at age 21.  Between ages 21 and 29, Pap tests should be repeated every 2 years.  Beginning at age 30, you should have a Pap test every 3 years as long as the past 3 Pap tests have been normal.  Some women have medical problems that increase the chance of getting cervical cancer. Talk to your caregiver about these problems. It is especially important to talk to your caregiver if a new problem develops soon after your last Pap test. In these cases, your caregiver may recommend more frequent screening and Pap tests.  The above recommendations are the same for women who have or have not gotten the vaccine for human papillomavirus (HPV).  If you had a hysterectomy for a problem that was not cancer or a condition that could lead to cancer, then you no longer need Pap tests. Even if you no longer need a Pap test, a regular exam is a good idea to make sure no other problems are  starting.  If you are between ages 65 and 70, and you have had normal Pap tests going back 10 years, you no longer need Pap tests. Even if you no longer need a Pap test, a regular exam is a good idea to make sure no other problems are starting.  If you have had past treatment for cervical cancer or a condition that could lead to cancer, you need Pap tests and screening for cancer for at least 20 years after your treatment.  If Pap tests have been discontinued, risk factors (such as a new sexual partner) need to be reassessed to determine if screening should be resumed.  The HPV test is an additional test that may be used for cervical cancer screening. The HPV test looks for the virus that can cause the cell changes on the cervix. The cells collected during the Pap test can be tested for HPV. The HPV test could be used to screen women aged 30 years and older, and should   be used in women of any age who have unclear Pap test results. After the age of 30, women should have HPV testing at the same frequency as a Pap test.  Colorectal cancer can be detected and often prevented. Most routine colorectal cancer screening begins at the age of 50 and continues through age 75. However, your caregiver may recommend screening at an earlier age if you have risk factors for colon cancer. On a yearly basis, your caregiver may provide home test kits to check for hidden blood in the stool. Use of a small camera at the end of a tube, to directly examine the colon (sigmoidoscopy or colonoscopy), can detect the earliest forms of colorectal cancer. Talk to your caregiver about this at age 50, when routine screening begins. Direct examination of the colon should be repeated every 5 to 10 years through age 75, unless early forms of pre-cancerous polyps or small growths are found.  Hepatitis C blood testing is recommended for all people born from 1945 through 1965 and any individual with known risks for hepatitis C.  Practice  safe sex. Use condoms and avoid high-risk sexual practices to reduce the spread of sexually transmitted infections (STIs). STIs include gonorrhea, chlamydia, syphilis, trichomonas, herpes, HPV, and human immunodeficiency virus (HIV). Herpes, HIV, and HPV are viral illnesses that have no cure. They can result in disability, cancer, and death. Sexually active women aged 25 and younger should be checked for chlamydia. Older women with new or multiple partners should also be tested for chlamydia. Testing for other STIs is recommended if you are sexually active and at increased risk.  Osteoporosis is a disease in which the bones lose minerals and strength with aging. This can result in serious bone fractures. The risk of osteoporosis can be identified using a bone density scan. Women ages 65 and over and women at risk for fractures or osteoporosis should discuss screening with their caregivers. Ask your caregiver whether you should take a calcium supplement or vitamin D to reduce the rate of osteoporosis.  Menopause can be associated with physical symptoms and risks. Hormone replacement therapy is available to decrease symptoms and risks. You should talk to your caregiver about whether hormone replacement therapy is right for you.  Use sunscreen with sun protection factor (SPF) of 30 or more. Apply sunscreen liberally and repeatedly throughout the day. You should seek shade when your shadow is shorter than you. Protect yourself by wearing long sleeves, pants, a wide-brimmed hat, and sunglasses year round, whenever you are outdoors.  Once a month, do a whole body skin exam, using a mirror to look at the skin on your back. Notify your caregiver of new moles, moles that have irregular borders, moles that are larger than a pencil eraser, or moles that have changed in shape or color.  Stay current with required immunizations.  Influenza. You need a dose every fall (or winter). The composition of the flu vaccine  changes each year, so being vaccinated once is not enough.  Pneumococcal polysaccharide. You need 1 to 2 doses if you smoke cigarettes or if you have certain chronic medical conditions. You need 1 dose at age 65 (or older) if you have never been vaccinated.  Tetanus, diphtheria, pertussis (Tdap, Td). Get 1 dose of Tdap vaccine if you are younger than age 65, are over 65 and have contact with an infant, are a healthcare worker, are pregnant, or simply want to be protected from whooping cough. After that, you need a Td   booster dose every 10 years. Consult your caregiver if you have not had at least 3 tetanus and diphtheria-containing shots sometime in your life or have a deep or dirty wound.  HPV. You need this vaccine if you are a woman age 26 or younger. The vaccine is given in 3 doses over 6 months.  Measles, mumps, rubella (MMR). You need at least 1 dose of MMR if you were born in 1957 or later. You may also need a second dose.  Meningococcal. If you are age 19 to 21 and a first-year college student living in a residence hall, or have one of several medical conditions, you need to get vaccinated against meningococcal disease. You may also need additional booster doses.  Zoster (shingles). If you are age 60 or older, you should get this vaccine.  Varicella (chickenpox). If you have never had chickenpox or you were vaccinated but received only 1 dose, talk to your caregiver to find out if you need this vaccine.  Hepatitis A. You need this vaccine if you have a specific risk factor for hepatitis A virus infection or you simply wish to be protected from this disease. The vaccine is usually given as 2 doses, 6 to 18 months apart.  Hepatitis B. You need this vaccine if you have a specific risk factor for hepatitis B virus infection or you simply wish to be protected from this disease. The vaccine is given in 3 doses, usually over 6 months. Preventive Services / Frequency Ages 19 to 39  Blood  pressure check.** / Every 1 to 2 years.  Lipid and cholesterol check.** / Every 5 years beginning at age 20.  Clinical breast exam.** / Every 3 years for women in their 20s and 30s.  Pap test.** / Every 2 years from ages 21 through 29. Every 3 years starting at age 30 through age 65 or 70 with a history of 3 consecutive normal Pap tests.  HPV screening.** / Every 3 years from ages 30 through ages 65 to 70 with a history of 3 consecutive normal Pap tests.  Hepatitis C blood test.** / For any individual with known risks for hepatitis C.  Skin self-exam. / Monthly.  Influenza immunization.** / Every year.  Pneumococcal polysaccharide immunization.** / 1 to 2 doses if you smoke cigarettes or if you have certain chronic medical conditions.  Tetanus, diphtheria, pertussis (Tdap, Td) immunization. / A one-time dose of Tdap vaccine. After that, you need a Td booster dose every 10 years.  HPV immunization. / 3 doses over 6 months, if you are 26 and younger.  Measles, mumps, rubella (MMR) immunization. / You need at least 1 dose of MMR if you were born in 1957 or later. You may also need a second dose.  Meningococcal immunization. / 1 dose if you are age 19 to 21 and a first-year college student living in a residence hall, or have one of several medical conditions, you need to get vaccinated against meningococcal disease. You may also need additional booster doses.  Varicella immunization.** / Consult your caregiver.  Hepatitis A immunization.** / Consult your caregiver. 2 doses, 6 to 18 months apart.  Hepatitis B immunization.** / Consult your caregiver. 3 doses usually over 6 months. Ages 40 to 64  Blood pressure check.** / Every 1 to 2 years.  Lipid and cholesterol check.** / Every 5 years beginning at age 20.  Clinical breast exam.** / Every year after age 40.  Mammogram.** / Every year beginning at age 40   and continuing for as long as you are in good health. Consult with your  caregiver.  Pap test.** / Every 3 years starting at age 30 through age 65 or 70 with a history of 3 consecutive normal Pap tests.  HPV screening.** / Every 3 years from ages 30 through ages 65 to 70 with a history of 3 consecutive normal Pap tests.  Fecal occult blood test (FOBT) of stool. / Every year beginning at age 50 and continuing until age 75. You may not need to do this test if you get a colonoscopy every 10 years.  Flexible sigmoidoscopy or colonoscopy.** / Every 5 years for a flexible sigmoidoscopy or every 10 years for a colonoscopy beginning at age 50 and continuing until age 75.  Hepatitis C blood test.** / For all people born from 1945 through 1965 and any individual with known risks for hepatitis C.  Skin self-exam. / Monthly.  Influenza immunization.** / Every year.  Pneumococcal polysaccharide immunization.** / 1 to 2 doses if you smoke cigarettes or if you have certain chronic medical conditions.  Tetanus, diphtheria, pertussis (Tdap, Td) immunization.** / A one-time dose of Tdap vaccine. After that, you need a Td booster dose every 10 years.  Measles, mumps, rubella (MMR) immunization. / You need at least 1 dose of MMR if you were born in 1957 or later. You may also need a second dose.  Varicella immunization.** / Consult your caregiver.  Meningococcal immunization.** / Consult your caregiver.  Hepatitis A immunization.** / Consult your caregiver. 2 doses, 6 to 18 months apart.  Hepatitis B immunization.** / Consult your caregiver. 3 doses, usually over 6 months. Ages 65 and over  Blood pressure check.** / Every 1 to 2 years.  Lipid and cholesterol check.** / Every 5 years beginning at age 20.  Clinical breast exam.** / Every year after age 40.  Mammogram.** / Every year beginning at age 40 and continuing for as long as you are in good health. Consult with your caregiver.  Pap test.** / Every 3 years starting at age 30 through age 65 or 70 with a 3  consecutive normal Pap tests. Testing can be stopped between 65 and 70 with 3 consecutive normal Pap tests and no abnormal Pap or HPV tests in the past 10 years.  HPV screening.** / Every 3 years from ages 30 through ages 65 or 70 with a history of 3 consecutive normal Pap tests. Testing can be stopped between 65 and 70 with 3 consecutive normal Pap tests and no abnormal Pap or HPV tests in the past 10 years.  Fecal occult blood test (FOBT) of stool. / Every year beginning at age 50 and continuing until age 75. You may not need to do this test if you get a colonoscopy every 10 years.  Flexible sigmoidoscopy or colonoscopy.** / Every 5 years for a flexible sigmoidoscopy or every 10 years for a colonoscopy beginning at age 50 and continuing until age 75.  Hepatitis C blood test.** / For all people born from 1945 through 1965 and any individual with known risks for hepatitis C.  Osteoporosis screening.** / A one-time screening for women ages 65 and over and women at risk for fractures or osteoporosis.  Skin self-exam. / Monthly.  Influenza immunization.** / Every year.  Pneumococcal polysaccharide immunization.** / 1 dose at age 65 (or older) if you have never been vaccinated.  Tetanus, diphtheria, pertussis (Tdap, Td) immunization. / A one-time dose of Tdap vaccine if you are over   65 and have contact with an infant, are a healthcare worker, or simply want to be protected from whooping cough. After that, you need a Td booster dose every 10 years.  Varicella immunization.** / Consult your caregiver.  Meningococcal immunization.** / Consult your caregiver.  Hepatitis A immunization.** / Consult your caregiver. 2 doses, 6 to 18 months apart.  Hepatitis B immunization.** / Check with your caregiver. 3 doses, usually over 6 months. ** Family history and personal history of risk and conditions may change your caregiver's recommendations. Document Released: 05/26/2001 Document Revised: 06/22/2011  Document Reviewed: 08/25/2010 [employer] Patient Information 2013 [employer].  
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
## 2012-04-28 NOTE — Assessment & Plan Note (Addendum)
Check labs 

## 2012-04-29 ENCOUNTER — Other Ambulatory Visit (INDEPENDENT_AMBULATORY_CARE_PROVIDER_SITE_OTHER): Payer: BC Managed Care – PPO

## 2012-04-29 DIAGNOSIS — Z0000 Encounter for general adult medical examination without abnormal findings: Secondary | ICD-10-CM

## 2012-04-29 LAB — CBC WITH DIFFERENTIAL/PLATELET
Basophils Relative: 1 % (ref 0.0–3.0)
Eosinophils Absolute: 0.1 10*3/uL (ref 0.0–0.7)
Eosinophils Relative: 4.1 % (ref 0.0–5.0)
Hemoglobin: 13.8 g/dL (ref 12.0–15.0)
Lymphocytes Relative: 50.2 % — ABNORMAL HIGH (ref 12.0–46.0)
MCHC: 34.1 g/dL (ref 30.0–36.0)
MCV: 91.3 fl (ref 78.0–100.0)
Neutro Abs: 1.1 10*3/uL — ABNORMAL LOW (ref 1.4–7.7)
Neutrophils Relative %: 38.3 % — ABNORMAL LOW (ref 43.0–77.0)
RBC: 4.42 Mil/uL (ref 3.87–5.11)
WBC: 2.8 10*3/uL — ABNORMAL LOW (ref 4.5–10.5)

## 2012-04-29 LAB — POCT URINALYSIS DIPSTICK
Blood, UA: NEGATIVE
Glucose, UA: NEGATIVE
Ketones, UA: NEGATIVE
Spec Grav, UA: >=1.03
Urobilinogen, UA: 0.2

## 2012-04-29 LAB — LIPID PANEL
Cholesterol: 220 mg/dL — ABNORMAL HIGH (ref 0–200)
Total CHOL/HDL Ratio: 4
Triglycerides: 56 mg/dL (ref 0.0–149.0)
VLDL: 11.2 mg/dL (ref 0.0–40.0)

## 2012-04-29 LAB — BASIC METABOLIC PANEL
CO2: 30 mEq/L (ref 19–32)
Calcium: 9.4 mg/dL (ref 8.4–10.5)
Chloride: 104 mEq/L (ref 96–112)
Sodium: 140 mEq/L (ref 135–145)

## 2012-04-29 LAB — LDL CHOLESTEROL, DIRECT: Direct LDL: 136.5 mg/dL

## 2012-04-29 LAB — HEPATIC FUNCTION PANEL
Albumin: 4.2 g/dL (ref 3.5–5.2)
Total Protein: 6.9 g/dL (ref 6.0–8.3)

## 2012-04-29 LAB — TSH: TSH: 1 u[IU]/mL (ref 0.35–5.50)

## 2012-06-01 ENCOUNTER — Ambulatory Visit
Admission: RE | Admit: 2012-06-01 | Discharge: 2012-06-01 | Disposition: A | Discharge status: Home / Self Care | Payer: BC Managed Care – PPO | Source: Ambulatory Visit | Attending: Family Medicine | Admitting: Family Medicine

## 2012-06-01 DIAGNOSIS — Z1231 Encounter for screening mammogram for malignant neoplasm of breast: Secondary | ICD-10-CM

## 2012-06-24 ENCOUNTER — Telehealth: Payer: Self-pay | Admitting: Family Medicine

## 2012-06-24 ENCOUNTER — Encounter: Payer: Self-pay | Admitting: Family Medicine

## 2012-06-24 ENCOUNTER — Other Ambulatory Visit: Payer: Self-pay | Admitting: Family Medicine

## 2012-06-24 ENCOUNTER — Ambulatory Visit (INDEPENDENT_AMBULATORY_CARE_PROVIDER_SITE_OTHER): Payer: BC Managed Care – PPO | Admitting: Family Medicine

## 2012-06-24 VITALS — BP 136/82 | HR 72 | Temp 98.3°F | Wt 158.0 lb

## 2012-06-24 DIAGNOSIS — M545 Low back pain, unspecified: Secondary | ICD-10-CM

## 2012-06-24 DIAGNOSIS — R222 Localized swelling, mass and lump, trunk: Secondary | ICD-10-CM

## 2012-06-24 LAB — BASIC METABOLIC PANEL
BUN: 15 mg/dL (ref 6–23)
CO2: 28 mEq/L (ref 19–32)
Chloride: 107 mEq/L (ref 96–112)
Creatinine, Ser: 1 mg/dL (ref 0.4–1.2)
Potassium: 3.9 mEq/L (ref 3.5–5.1)

## 2012-06-24 NOTE — Telephone Encounter (Signed)
If radiologist says we can see the area if I order neck --I'll order neck

## 2012-06-24 NOTE — Telephone Encounter (Signed)
To MD for review     KP 

## 2012-06-24 NOTE — Telephone Encounter (Signed)
CT of the neck entered with contrast per Dr.Lowne     KP

## 2012-06-24 NOTE — Telephone Encounter (Addendum)
Per my call to patient's primary insurance, [employer], CT Neck is approved.  Per my call to patient's secondary insurance, [facility], peer to peer is required.  Please call [phone], select option 3, use ref# [id number].  This case will remain open for only 3 business days for a peer to peer.

## 2012-06-24 NOTE — Patient Instructions (Signed)
Back Pain, Adult Low back pain is very common. About 1 in 5 people have back pain.The cause of low back pain is rarely dangerous. The pain often gets better over time.About half of people with a sudden onset of back pain feel better in just 2 weeks. About 8 in 10 people feel better by 6 weeks.  CAUSES Some common causes of back pain include:  Strain of the muscles or ligaments supporting the spine.  Wear and tear (degeneration) of the spinal discs.  Arthritis.  Direct injury to the back. DIAGNOSIS Most of the time, the direct cause of low back pain is not known.However, back pain can be treated effectively even when the exact cause of the pain is unknown.Answering your caregiver's questions about your overall health and symptoms is one of the most accurate ways to make sure the cause of your pain is not dangerous. If your caregiver needs more information, he or she may order lab work or imaging tests (X-rays or MRIs).However, even if imaging tests show changes in your back, this usually does not require surgery. HOME CARE INSTRUCTIONS For many people, back pain returns.Since low back pain is rarely dangerous, it is often a condition that people can learn to manageon their own.   Remain active. It is stressful on the back to sit or stand in one place. Do not sit, drive, or stand in one place for more than 30 minutes at a time. Take short walks on level surfaces as soon as pain allows.Try to increase the length of time you walk each day.  Do not stay in bed.Resting more than 1 or 2 days can delay your recovery.  Do not avoid exercise or work.Your body is made to move.It is not dangerous to be active, even though your back may hurt.Your back will likely heal faster if you return to being active before your pain is gone.  Pay attention to your body when you bend and lift. Many people have less discomfortwhen lifting if they bend their knees, keep the load close to their bodies,and  avoid twisting. Often, the most comfortable positions are those that put less stress on your recovering back.  Find a comfortable position to sleep. Use a firm mattress and lie on your side with your knees slightly bent. If you lie on your back, put a pillow under your knees.  Only take over-the-counter or prescription medicines as directed by your caregiver. Over-the-counter medicines to reduce pain and inflammation are often the most helpful.Your caregiver may prescribe muscle relaxant drugs.These medicines help dull your pain so you can more quickly return to your normal activities and healthy exercise.  Put ice on the injured area.  Put ice in a plastic bag.  Place a towel between your skin and the bag.  Leave the ice on for 15 to 20 minutes, 3 to 4 times a day for the first 2 to 3 days. After that, ice and heat may be alternated to reduce pain and spasms.  Ask your caregiver about trying back exercises and gentle massage. This may be of some benefit.  Avoid feeling anxious or stressed.Stress increases muscle tension and can worsen back pain.It is important to recognize when you are anxious or stressed and learn ways to manage it.Exercise is a great option. SEEK MEDICAL CARE IF:  You have pain that is not relieved with rest or medicine.  You have pain that does not improve in 1 week.  You have new symptoms.  You are generally   not feeling well. SEEK IMMEDIATE MEDICAL CARE IF:   You have pain that radiates from your back into your legs.  You develop new bowel or bladder control problems.  You have unusual weakness or numbness in your arms or legs.  You develop nausea or vomiting.  You develop abdominal pain.  You feel faint. Document Released: 03/30/2005 Document Revised: 09/29/2011 Document Reviewed: 08/18/2010 Exit[employer] Patient Information 2013 [employer].  

## 2012-06-24 NOTE — Addendum Note (Signed)
Addended by: Arnette Norris on: 06/24/2012 02:24 PM   Modules accepted: Orders

## 2012-06-24 NOTE — Progress Notes (Signed)
  Subjective:    Christine Walker is a 62 y.o. female who presents for follow up of low back problems. Current symptoms include: pain in r low back (aching and throbbing in character; 5/10 in severity). Symptoms have not changed from the previous visit. Exacerbating factors identified by the patient are sitting, standing and walking.  The following portions of the patient's history were reviewed and updated as appropriate: allergies, current medications, past family history, past medical history, past social history, past surgical history and problem list.    Objective:    BP 136/82  Pulse 72  Temp(Src) 98.3 F (36.8 C) (Oral)  Wt 158 lb (71.668 kg)  BMI 24.74 kg/m2  SpO2 97% General appearance: alert, cooperative, appears stated age and no distress Throat: lips, mucosa, and tongue normal; teeth and gums normal Neck: no adenopathy, supple, symmetrical, trachea midline and + thyroid enlarged ,  + supraclavicular fullness b/l  Neurologic: Alert and oriented X 3, normal strength and tone. Normal symmetric reflexes. Normal coordination and gait    Assessment:    Nonspecific acute low back pain   supraclavicular fullness--- check mri Plan:    Natural history and expected course discussed. Questions answered. Stretching exercises discussed. Regular aerobic and trunk strengthening exercises discussed. Short (2-4 day) period of relative rest recommended until acute symptoms improve. Ice to affected area as needed for local pain relief. Heat to affected area as needed for local pain relief. PT referral.

## 2012-06-24 NOTE — Telephone Encounter (Signed)
If primary is covering it why do i have to call secondary?

## 2012-06-24 NOTE — Telephone Encounter (Signed)
-----   Message ----- From: Duaine Dredge Sent: 06/24/2012 12:09 PM To: Lelon Perla, DO Subject: MRI ORDER CHANGE Dr. Laury Axon also for this patient, Per radiology please remove the current order of mri chest with, and change to mri chest with & without. The radiologist did tell me with, however radiology states anytime an mri is with contrast, it is also always done without. Thank you :)    Done ----    KP

## 2012-06-24 NOTE — Telephone Encounter (Signed)
Per [employer], for the MRI Chest with/without, a peer to peer will be required.  If you wish to do so, please call [phone], select option 2, reference patient's ID of [id number].  Also asked was why ordering the mri of the chest instead of the neck.  Please reply with reason so I will have the answer when scheduling this patient and another.

## 2012-06-25 ENCOUNTER — Other Ambulatory Visit: Payer: Self-pay | Admitting: Internal Medicine

## 2012-06-27 DIAGNOSIS — R739 Hyperglycemia, unspecified: Secondary | ICD-10-CM

## 2012-06-29 ENCOUNTER — Other Ambulatory Visit: Payer: BC Managed Care – PPO

## 2012-06-30 NOTE — Telephone Encounter (Signed)
My misunderstanding.  I verified with [employer] about pre-cert, and any time [facility] is secondary to another plan, they do not require pre-cert.  Patient is scheduled for her Ct Scan.

## 2012-07-01 ENCOUNTER — Other Ambulatory Visit: Payer: BC Managed Care – PPO

## 2012-07-04 ENCOUNTER — Encounter: Payer: Self-pay | Admitting: Family Medicine

## 2012-07-05 ENCOUNTER — Telehealth: Payer: Self-pay | Admitting: *Deleted

## 2012-07-05 NOTE — Telephone Encounter (Signed)
ok 

## 2012-07-05 NOTE — Telephone Encounter (Signed)
Pt states that she has already reschedule appt and is to have this done tomorrow. Pt notes that she was out of town when she got message.

## 2012-07-05 NOTE — Telephone Encounter (Signed)
VM left to report that Pt no show for her CT scan on Friday 07-01-12

## 2012-07-05 NOTE — Telephone Encounter (Signed)
Call pt--- she was so concerned about it at [facility]

## 2012-07-06 ENCOUNTER — Ambulatory Visit
Admission: RE | Admit: 2012-07-06 | Discharge: 2012-07-06 | Disposition: A | Discharge status: Home / Self Care | Payer: BC Managed Care – PPO | Source: Ambulatory Visit | Attending: Family Medicine | Admitting: Family Medicine

## 2012-07-06 DIAGNOSIS — R222 Localized swelling, mass and lump, trunk: Secondary | ICD-10-CM

## 2012-07-06 MED ORDER — IOHEXOL 300 MG/ML  SOLN
75.0000 mL | Freq: Once | INTRAMUSCULAR | Status: AC | PRN
  Administered 2012-07-06: 75 mL via INTRAVENOUS

## 2012-07-12 ENCOUNTER — Telehealth: Payer: Self-pay | Admitting: Family Medicine

## 2012-07-12 NOTE — Telephone Encounter (Signed)
Discussed with patient and made her aware that I will call the ENT and the Endo physicians and see what they recommend and get back to her.      KP

## 2012-07-12 NOTE — Telephone Encounter (Signed)
Patient calling, states she is very concerned that our office has not called her with her recent 06/24/12 CT Neck results.  Patient would like a call back asap please.

## 2012-07-13 NOTE — Telephone Encounter (Signed)
Labs were faxed to Dr.Rosen and Dr.Balan.     KP

## 2012-10-06 ENCOUNTER — Other Ambulatory Visit: Payer: Self-pay | Admitting: Endocrinology

## 2012-10-06 DIAGNOSIS — E049 Nontoxic goiter, unspecified: Secondary | ICD-10-CM

## 2012-10-11 LAB — HM MAMMOGRAPHY

## 2012-10-12 ENCOUNTER — Ambulatory Visit
Admission: RE | Admit: 2012-10-12 | Discharge: 2012-10-12 | Disposition: A | Discharge status: Home / Self Care | Payer: BC Managed Care – PPO | Source: Ambulatory Visit | Attending: Endocrinology | Admitting: Endocrinology

## 2012-10-12 DIAGNOSIS — E049 Nontoxic goiter, unspecified: Secondary | ICD-10-CM

## 2013-02-09 ENCOUNTER — Ambulatory Visit (INDEPENDENT_AMBULATORY_CARE_PROVIDER_SITE_OTHER): Payer: BC Managed Care – PPO | Admitting: Internal Medicine

## 2013-02-09 ENCOUNTER — Encounter: Payer: Self-pay | Admitting: Internal Medicine

## 2013-02-09 VITALS — BP 156/75 | HR 77 | Temp 98.8°F | Wt 147.8 lb

## 2013-02-09 DIAGNOSIS — J069 Acute upper respiratory infection, unspecified: Secondary | ICD-10-CM

## 2013-02-09 DIAGNOSIS — M549 Dorsalgia, unspecified: Secondary | ICD-10-CM

## 2013-02-09 DIAGNOSIS — J209 Acute bronchitis, unspecified: Secondary | ICD-10-CM

## 2013-02-09 MED ORDER — HYDROCODONE-HOMATROPINE 5-1.5 MG/5ML PO SYRP: 5.0000 mL | ORAL_SOLUTION | Freq: Four times a day (QID) | ORAL | Status: DC | PRN

## 2013-02-09 MED ORDER — TRAMADOL HCL 50 MG PO TABS: 50.0000 mg | ORAL_TABLET | Freq: Three times a day (TID) | ORAL | Status: DC | PRN

## 2013-02-09 MED ORDER — AZITHROMYCIN 250 MG PO TABS: ORAL_TABLET | ORAL | Status: DC

## 2013-02-09 NOTE — Patient Instructions (Addendum)
The best exercises for the low back include freestyle swimming, stretch aerobics, and yoga. Chiropractory or physical therapy can help relieve the pain & increase mobility. Caries ( cavities) and plaque on the teeth can lead to significant local and systemic infections with threat to your health.Please have dental care completed as soon as possible.
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
## 2013-03-17 ENCOUNTER — Encounter: Payer: Self-pay | Admitting: Lab

## 2013-03-20 ENCOUNTER — Ambulatory Visit: Payer: BC Managed Care – PPO | Admitting: Family Medicine

## 2013-03-20 ENCOUNTER — Ambulatory Visit (INDEPENDENT_AMBULATORY_CARE_PROVIDER_SITE_OTHER): Payer: BC Managed Care – PPO | Admitting: Internal Medicine

## 2013-03-20 ENCOUNTER — Encounter: Payer: Self-pay | Admitting: Internal Medicine

## 2013-03-20 VITALS — BP 162/91 | HR 61 | Temp 98.5°F | Wt 148.6 lb

## 2013-03-20 DIAGNOSIS — H9312 Tinnitus, left ear: Secondary | ICD-10-CM

## 2013-03-20 DIAGNOSIS — H9319 Tinnitus, unspecified ear: Secondary | ICD-10-CM

## 2013-03-20 DIAGNOSIS — R030 Elevated blood-pressure reading, without diagnosis of hypertension: Secondary | ICD-10-CM

## 2013-03-20 MED ORDER — HYDROCHLOROTHIAZIDE 12.5 MG PO CAPS: 12.5000 mg | ORAL_CAPSULE | Freq: Every day | ORAL | Status: DC

## 2013-03-20 NOTE — Progress Notes (Signed)
   Subjective:    Patient ID: Christine Walker, female    DOB: October 05, 1950, 62 y.o.   MRN: [id number]  HPI   Symptoms began as an abnormal sound in the left ear which she describes as "Morse Code", initially 03/14/13. She denies actual ringing in the ear; there was no associated hearing loss. This has been intermittent over the last week and most recently occurred Friday 03/17/13. She's had some associated chills without fever or sweats. She has not treated the symptoms.  She has not ingested excess aspirin and has not had exposure to loud noise.  She has no history of hypertension; her husband has hypertension. He checked her blood pressure 12/5; systolic blood pressure was 160 at that time.  She was rushed today as she had to provide medical assistance for persisted no acute issue.    Review of Systems  She denies frontal headache, facial pain, nasal purulence, dental pain, sore throat, otic pain, or discharge from the ear.  She denies any cough or sputum production.     Objective:   Physical Exam General appearance:good health ;well nourished; no acute distress or increased work of breathing is present.  No  lymphadenopathy about the head, neck, or axilla noted.   Eyes: No conjunctival inflammation or lid edema is present.  Extraocular motion intact without nystagmus  Ears:  External ear exam shows no significant lesions or deformities.  Otoscopic examination reveals clear canals, tympanic membranes are intact bilaterally without bulging, retraction, inflammation or discharge. Tuning fork exam lateralizes to the left. Air conduction is greater than bone conduction bilaterally  Nose:  External nasal examination shows no deformity or inflammation. Nasal mucosa are pink and moist without lesions or exudates. No septal dislocation or deviation.No obstruction to airflow.   Oral exam: Dental hygiene is good; lips and gums are healthy appearing.There is no oropharyngeal erythema or exudate  noted. Slight dislocation with TNJ testing  Neck:  No deformities,  masses, or tenderness noted.   Goiter on R   Heart:  Normal rate and regular rhythm. S1 and S2 normal without gallop, murmur, click, rub or other extra sounds.   No carotid bruits present  Lungs:Chest clear to auscultation; no wheezes, rhonchi,rales ,or rubs present.No increased work of breathing.    Extremities:  No cyanosis, edema, or clubbing  noted    Skin: Warm & dry .  Romberg and finger to nose testing negative         Assessment & Plan:  #1 tinnitus variant; this last occurred 12/5.  #2 subjective lateralization of tuning fork the left ear  #3 elevated blood pressure without history of hypertension. Recheck revealed a value of 120/90. She'll be asked to monitor it at home. The abnormal sound she is carrying could possibly be related to flow changes in the left carotid artery as well in view of the elevated blood pressure.  .  The symptoms recur, persist, or progress ENT consultation indicate

## 2013-03-20 NOTE — Patient Instructions (Addendum)
Minimal Blood Pressure Goal= AVERAGE < 140/90;  Ideal is an AVERAGE < 135/85. This AVERAGE should be calculated from @ least 5-7 BP readings taken @ different times of day on different days of week. You should not respond to isolated BP readings , but rather the AVERAGE for that week .Please bring your  blood pressure cuff to office visits to verify that it is reliable.It  can also be checked against the blood pressure device at the pharmacy.  Fill the  prescription for the BP medication if BP NOT @ goal based on  7 to 10 day average.  I recommend an ENT consultation to determine optimal therapy if abnormal ear sounds recur; please inform me if you have a physician preference.

## 2013-03-31 ENCOUNTER — Other Ambulatory Visit: Payer: Self-pay | Admitting: Gastroenterology

## 2013-04-07 ENCOUNTER — Ambulatory Visit (INDEPENDENT_AMBULATORY_CARE_PROVIDER_SITE_OTHER): Payer: BC Managed Care – PPO | Admitting: Internal Medicine

## 2013-04-07 ENCOUNTER — Encounter: Payer: Self-pay | Admitting: Internal Medicine

## 2013-04-07 VITALS — BP 155/76 | HR 60 | Temp 98.0°F | Resp 18 | Wt 148.0 lb

## 2013-04-07 DIAGNOSIS — S61012A Laceration without foreign body of left thumb without damage to nail, initial encounter: Secondary | ICD-10-CM

## 2013-04-07 DIAGNOSIS — S61209A Unspecified open wound of unspecified finger without damage to nail, initial encounter: Secondary | ICD-10-CM

## 2013-04-07 NOTE — Progress Notes (Signed)
   Subjective:    Patient ID: Christine Walker, female    DOB: [birth year], 62 y.o.   MRN: [id number]  HPI   She cut her left thumb with a knife last night approximately 10:30-11:00 pm. There was some active bleeding but she was able to stop this. She is here to see if it requires sutures.  She has been flushing it every 4 hours with hydrogen peroxide.  Her tetanus shot is up-to-date.   Review of Systems  there's been no drainage from the wound since the injury. She has no fever, chills, or sweats.      Objective:   Physical Exam  There is a 28 mm laceration over the distal left thumb medially. It is closed. There is no sign of cellulitis or purulence  She has no epitrochlear or axillary lymphadenopathy.  Radial artery pulse is excellent        Assessment & Plan:  #1 clean laceration with no sign of secondary cellulitis. Tetanus status up-to-date  Plan: Simple cleansing once a day and using Steri-Strips to keep the wound closed. She should stop hydroperoxide. She should report any signs of cellulitis such purulence or red streaks emanating toward the body.

## 2013-04-07 NOTE — Patient Instructions (Signed)
Cleanse the wound with soap &  water once daily. Reapply clean Steri-Strips after cleansing. Do not use any peroxide or apply any antibiotic ointment. Report fever, pus, or red streaks extending up the thumb toward the body.

## 2013-04-13 LAB — HM DEXA SCAN: HM DEXA SCAN: NORMAL

## 2013-06-20 ENCOUNTER — Encounter: Payer: Self-pay | Admitting: Family Medicine

## 2013-07-10 ENCOUNTER — Telehealth: Payer: Self-pay | Admitting: Family Medicine

## 2013-07-10 NOTE — Telephone Encounter (Signed)
Patient called stating that she is in [place] and has a dry cough for four days. She states that she had a fever on Saturday but she didn't check it. Patient would like something called in for her. I did inform patient that most likely she would need to be seen by a provider.  Please advise.

## 2013-07-10 NOTE — Telephone Encounter (Signed)
Spoke with patient who states she is in [place] and will not be back until sometime next week at the earliest. I advised the patient to seek out an UC near her as we can't send any medications without 1) seeing her in the office and 2) across state lines. Patient stated she would go to an urgent care in [place] for treatment.

## 2013-08-14 ENCOUNTER — Telehealth: Payer: Self-pay | Admitting: Family Medicine

## 2013-08-14 NOTE — Telephone Encounter (Signed)
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

## 2013-08-14 NOTE — Telephone Encounter (Signed)
Pt is requesting to switch from Dr. Etter Sjogren to Dr. Linna Darner.  Will this be ok?

## 2013-08-14 NOTE — Telephone Encounter (Signed)
Patient called me after speaking with Izora Gala. I advised patient that even though she saw Dr. Linna Darner for some acute visits, Dr. Etter Sjogren was still her PCP and Dr. Linna Darner was not accepting new patients. Patient understood and is staying with Dr. Etter Sjogren.

## 2013-08-14 NOTE — Telephone Encounter (Signed)
Dr Linna Darner is not taking new patients

## 2013-08-14 NOTE — Telephone Encounter (Signed)
Spoke with patient who states that she "feels like there is a knot in my throat." Patient describes feelign as "trying to swallow a lot of pills that will not go down." Pt triaged and denies any other sx. Appt scheduled Wed at 11:00.

## 2013-08-16 ENCOUNTER — Ambulatory Visit (INDEPENDENT_AMBULATORY_CARE_PROVIDER_SITE_OTHER): Payer: BC Managed Care – PPO | Admitting: Family Medicine

## 2013-08-16 ENCOUNTER — Encounter: Payer: Self-pay | Admitting: Family Medicine

## 2013-08-16 VITALS — BP 118/72 | HR 83 | Temp 98.0°F | Wt 144.0 lb

## 2013-08-16 DIAGNOSIS — R9431 Abnormal electrocardiogram [ECG] [EKG]: Secondary | ICD-10-CM

## 2013-08-16 DIAGNOSIS — K219 Gastro-esophageal reflux disease without esophagitis: Secondary | ICD-10-CM

## 2013-08-16 DIAGNOSIS — R079 Chest pain, unspecified: Secondary | ICD-10-CM

## 2013-08-16 LAB — CBC WITH DIFFERENTIAL/PLATELET
BASOS ABS: 0 10*3/uL (ref 0.0–0.1)
Basophils Relative: 0.6 % (ref 0.0–3.0)
EOS PCT: 3 % (ref 0.0–5.0)
Eosinophils Absolute: 0.1 10*3/uL (ref 0.0–0.7)
HCT: 42.4 % (ref 36.0–46.0)
Hemoglobin: 14.4 g/dL (ref 12.0–15.0)
Lymphocytes Relative: 36.4 % (ref 12.0–46.0)
Lymphs Abs: 1.4 10*3/uL (ref 0.7–4.0)
MCHC: 34 g/dL (ref 30.0–36.0)
MCV: 92.3 fl (ref 78.0–100.0)
MONO ABS: 0.3 10*3/uL (ref 0.1–1.0)
MONOS PCT: 7 % (ref 3.0–12.0)
NEUTROS PCT: 53 % (ref 43.0–77.0)
Neutro Abs: 2.1 10*3/uL (ref 1.4–7.7)
PLATELETS: 289 10*3/uL (ref 150.0–400.0)
RBC: 4.59 Mil/uL (ref 3.87–5.11)
RDW: 13.1 % (ref 11.5–15.5)
WBC: 3.9 10*3/uL — AB (ref 4.0–10.5)

## 2013-08-16 LAB — BASIC METABOLIC PANEL
BUN: 17 mg/dL (ref 6–23)
CO2: 31 mEq/L (ref 19–32)
CREATININE: 0.9 mg/dL (ref 0.4–1.2)
Calcium: 10.2 mg/dL (ref 8.4–10.5)
Chloride: 99 mEq/L (ref 96–112)
GFR: 86.29 mL/min (ref >60.00)
Glucose, Bld: 110 mg/dL — ABNORMAL HIGH (ref 70–99)
Potassium: 4.3 mEq/L (ref 3.5–5.1)
Sodium: 138 mEq/L (ref 135–145)

## 2013-08-16 LAB — HEPATIC FUNCTION PANEL
ALT: 18 U/L (ref 0–35)
AST: 21 U/L (ref 0–37)
Albumin: 4.3 g/dL (ref 3.5–5.2)
Alkaline Phosphatase: 59 U/L (ref 39–117)
BILIRUBIN DIRECT: 0 mg/dL (ref 0.0–0.3)
BILIRUBIN TOTAL: 0.3 mg/dL (ref 0.2–1.2)
Total Protein: 7.4 g/dL (ref 6.0–8.3)

## 2013-08-16 LAB — LIPASE: LIPASE: 44 U/L (ref 11.0–59.0)

## 2013-08-16 LAB — AMYLASE: Amylase: 91 U/L (ref 27–131)

## 2013-08-16 MED ORDER — GI COCKTAIL ~~LOC~~
30.0000 mL | Freq: Once | ORAL | Status: AC
  Administered 2013-08-16: 30 mL via ORAL

## 2013-08-16 MED ORDER — PANTOPRAZOLE SODIUM 40 MG PO TBEC: 40.0000 mg | DELAYED_RELEASE_TABLET | Freq: Every day | ORAL | Status: DC

## 2013-08-16 NOTE — Patient Instructions (Signed)

## 2013-08-16 NOTE — Progress Notes (Signed)
  Subjective:     Christine Walker is an 63 y.o. female who presents for evaluation of heartburn. This has been associated with chest pain, difficulty swallowing, dysphagia, heartburn and midespigastric pain. She denies belching, belching and eructation, choking on food, cough, early satiety, fullness after meals, melena and nausea. Symptoms have been present for a few weeks. She has had dysphagia for solids. She has not lost weight. She denies melena, hematochezia, hematemesis, and coffee ground emesis. Medical therapy in the past has included: proton pump inhibitors.-----she ran out and never refilled it  The following portions of the patient's history were reviewed and updated as appropriate: allergies, current medications, past family history, past medical history, past social history, past surgical history and problem list.  Review of Systems Pertinent items are noted in HPI.   Objective:     BP 118/72  Pulse 83  Temp(Src) 98 F (36.7 C) (Oral)  Wt 144 lb (65.318 kg)  SpO2 97% General appearance: alert, cooperative and no distress Throat: lips, mucosa, and tongue normal; teeth and gums normal Neck: no adenopathy, no carotid bruit, no JVD, supple, symmetrical, trachea midline and thyroid not enlarged, symmetric, no tenderness/mass/nodules Lungs: clear to auscultation bilaterally Heart: S1, S2 normal Abdomen: abnormal findings:  moderate tenderness in the epigastrium , soft  Assessment:    Gastroesophageal Reflux Disease,      Plan:    Will start a trial of proton pump inhibitors. Follow up in a few weeks or sooner as needed.  Refer GI Check labs

## 2013-08-16 NOTE — Progress Notes (Signed)
Pre visit review using our clinic review tool, if applicable. No additional management support is needed unless otherwise documented below in the visit note. 

## 2013-08-17 ENCOUNTER — Ambulatory Visit: Payer: BC Managed Care – PPO

## 2013-08-17 DIAGNOSIS — K219 Gastro-esophageal reflux disease without esophagitis: Secondary | ICD-10-CM

## 2013-08-18 LAB — H. PYLORI ANTIBODY, IGG: H Pylori IgG: 0.44 {ISR}

## 2013-09-06 ENCOUNTER — Encounter: Payer: Self-pay | Admitting: Cardiology

## 2013-09-06 ENCOUNTER — Ambulatory Visit (INDEPENDENT_AMBULATORY_CARE_PROVIDER_SITE_OTHER): Payer: BC Managed Care – PPO | Admitting: Cardiology

## 2013-09-06 VITALS — BP 122/72 | HR 76 | Ht 67.0 in | Wt 147.0 lb

## 2013-09-06 DIAGNOSIS — I330 Acute and subacute infective endocarditis: Secondary | ICD-10-CM

## 2013-09-06 DIAGNOSIS — I4510 Unspecified right bundle-branch block: Secondary | ICD-10-CM | POA: Insufficient documentation

## 2013-09-06 DIAGNOSIS — R079 Chest pain, unspecified: Secondary | ICD-10-CM

## 2013-09-06 HISTORY — DX: Unspecified right bundle-branch block: I45.10

## 2013-09-06 NOTE — Patient Instructions (Signed)
Your physician has requested that you have en exercise stress myoview. For further information please visit [email or web address]. Please follow instruction sheet, as given.  Your physician has requested that you have an echocardiogram. Echocardiography is a painless test that uses sound waves to create images of your heart. It provides your doctor with information about the size and shape of your heart and how well your heart's chambers and valves are working. This procedure takes approximately one hour. There are no restrictions for this procedure.  Your physician recommends that you continue on your current medications as directed. Please refer to the Current Medication list given to you today.  Your physician recommends that you schedule a follow-up appointment as needed with Dr. Radford Pax.
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
## 2013-09-27 ENCOUNTER — Ambulatory Visit (HOSPITAL_COMMUNITY): Payer: BC Managed Care – PPO | Attending: Cardiology | Admitting: Cardiology

## 2013-09-27 ENCOUNTER — Ambulatory Visit (HOSPITAL_BASED_OUTPATIENT_CLINIC_OR_DEPARTMENT_OTHER): Payer: BC Managed Care – PPO | Admitting: Radiology

## 2013-09-27 VITALS — BP 129/70 | Ht 67.0 in | Wt 147.0 lb

## 2013-09-27 DIAGNOSIS — Z8249 Family history of ischemic heart disease and other diseases of the circulatory system: Secondary | ICD-10-CM | POA: Insufficient documentation

## 2013-09-27 DIAGNOSIS — J45909 Unspecified asthma, uncomplicated: Secondary | ICD-10-CM | POA: Insufficient documentation

## 2013-09-27 DIAGNOSIS — I330 Acute and subacute infective endocarditis: Secondary | ICD-10-CM

## 2013-09-27 DIAGNOSIS — I4510 Unspecified right bundle-branch block: Secondary | ICD-10-CM

## 2013-09-27 DIAGNOSIS — I10 Essential (primary) hypertension: Secondary | ICD-10-CM | POA: Insufficient documentation

## 2013-09-27 DIAGNOSIS — R079 Chest pain, unspecified: Secondary | ICD-10-CM

## 2013-09-27 DIAGNOSIS — R0602 Shortness of breath: Secondary | ICD-10-CM

## 2013-09-27 MED ORDER — TECHNETIUM TC 99M SESTAMIBI GENERIC - CARDIOLITE
30.0000 | Freq: Once | INTRAVENOUS | Status: AC | PRN
  Administered 2013-09-27: 30 via INTRAVENOUS

## 2013-09-27 MED ORDER — TECHNETIUM TC 99M SESTAMIBI GENERIC - CARDIOLITE
10.0000 | Freq: Once | INTRAVENOUS | Status: AC | PRN
  Administered 2013-09-27: 10 via INTRAVENOUS

## 2013-09-27 NOTE — Progress Notes (Signed)
Echo performed. 

## 2013-09-27 NOTE — Progress Notes (Signed)
[facility] [address] [place], [place] [address] [phone]    Cardiology Nuclear Med Study  Christine Walker is a 63 y.o. female     MRN : [id number]     DOB: [birth year]  Procedure Date: 09/27/2013  Nuclear Med Background Indication for Stress Test:  Evaluation for Ischemia History:  Asthma and No H/O CAD, 2013 STRESS ECHO: EF: 60-65% NL, MPI: NL 78% Cardiac Risk Factors: Family History - CAD, Hypertension, Lipids and RBBB  Symptoms:  Chest Pain and DOE   Nuclear Pre-Procedure Caffeine/Decaff Intake:  None NPO After: 7:30pm   Lungs:  clear O2 Sat: 98% on room air. IV 0.9% NS with Angio Cath:  22g  IV Site: R Antecubital  IV Started by:  Crissie Figures, RN  Chest Size (in):  36 Cup Size: C  Height: 5\' 7"  (1.702 m)  Weight:  147 lb (66.679 kg)  BMI:  Body mass index is 23.02 kg/(m^2). Tech Comments:  N/A    Nuclear Med Study 1 or 2 day study: 1 day  Stress Test Type:  Stress  Reading MD: N/A  Order Authorizing Provider:  Fransico Him, MD  Resting Radionuclide: Technetium 99m Sestamibi  Resting Radionuclide Dose: 11.0 mCi   Stress Radionuclide:  Technetium 63m Sestamibi  Stress Radionuclide Dose: 33.0 mCi           Stress Protocol Rest HR: 61 Stress HR: 144  Rest BP: 129/70 Stress BP: 191/69  Exercise Time (min): 8:01 METS: 10.10   Predicted Max HR: 159 bpm % Max HR: 90.57 bpm Rate Pressure Product: 60737   Dose of Adenosine (mg):  n/a Dose of Lexiscan: n/a mg  Dose of Atropine (mg): n/a Dose of Dobutamine: n/a mcg/kg/min (at max HR)  Stress Test Technologist: Perrin Maltese, EMT-P  Nuclear Technologist:  Charlton Amor, CNMT     Rest Procedure:  Myocardial perfusion imaging was performed at rest 45 minutes following the intravenous administration of Technetium 10m Sestamibi. Rest ECG: NSR-RBBB  Stress Procedure:  The patient exercised on the treadmill utilizing the Bruce Protocol for 8:01 minutes. The patient stopped due to  fatigue and denied any chest pain.  Technetium 8m Sestamibi was injected at peak exercise and myocardial perfusion imaging was performed after a brief delay. Stress ECG: No significant change from baseline ECG  QPS Raw Data Images:  There is interference from nuclear activity from structures below the diaphragm. This does not affect the ability to read the study. Stress Images:  Normal homogeneous uptake in all areas of the myocardium. Rest Images:  Normal homogeneous uptake in all areas of the myocardium. Subtraction (SDS):  No evidence of ischemia. Transient Ischemic Dilatation (Normal <1.22):  0.91 Lung/Heart Ratio (Normal <0.45):  0.26  Quantitative Gated Spect Images QGS EDV:  59 ml QGS ESV:  12 ml  Impression Exercise Capacity:  Excellent exercise capacity. BP Response:  Hypertensive blood pressure response. Clinical Symptoms:  No significant symptoms noted. ECG Impression:  No significant ST segment change suggestive of ischemia. Comparison with Prior Nuclear Study: No images to compare  Overall Impression:  Normal stress nuclear study.  LV Ejection Fraction: 79%.  LV Wall Motion:  NL LV Function; NL Wall Motion  [name] 09/27/2013

## 2013-09-28 ENCOUNTER — Encounter: Payer: Self-pay | Admitting: Cardiology

## 2013-09-28 NOTE — Telephone Encounter (Signed)
This encounter was created in error - please disregard.

## 2013-09-28 NOTE — Telephone Encounter (Signed)
Patient is returning call from yesterday. Please call and advise.

## 2013-10-10 ENCOUNTER — Other Ambulatory Visit: Payer: Self-pay | Admitting: Endocrinology

## 2013-10-10 DIAGNOSIS — E049 Nontoxic goiter, unspecified: Secondary | ICD-10-CM

## 2013-10-12 ENCOUNTER — Other Ambulatory Visit: Payer: Self-pay | Admitting: Obstetrics and Gynecology

## 2013-10-12 ENCOUNTER — Ambulatory Visit
Admission: RE | Admit: 2013-10-12 | Discharge: 2013-10-12 | Disposition: A | Discharge status: Home / Self Care | Payer: BC Managed Care – PPO | Source: Ambulatory Visit | Attending: Endocrinology | Admitting: Endocrinology

## 2013-10-12 DIAGNOSIS — E049 Nontoxic goiter, unspecified: Secondary | ICD-10-CM

## 2013-10-12 DIAGNOSIS — N63 Unspecified lump in unspecified breast: Secondary | ICD-10-CM

## 2013-10-19 ENCOUNTER — Ambulatory Visit
Admission: RE | Admit: 2013-10-19 | Discharge: 2013-10-19 | Disposition: A | Discharge status: Home / Self Care | Payer: BC Managed Care – PPO | Source: Ambulatory Visit | Attending: Obstetrics and Gynecology | Admitting: Obstetrics and Gynecology

## 2013-10-19 ENCOUNTER — Encounter (INDEPENDENT_AMBULATORY_CARE_PROVIDER_SITE_OTHER): Payer: Self-pay

## 2013-10-19 DIAGNOSIS — N63 Unspecified lump in unspecified breast: Secondary | ICD-10-CM

## 2013-10-23 ENCOUNTER — Ambulatory Visit (INDEPENDENT_AMBULATORY_CARE_PROVIDER_SITE_OTHER): Payer: BC Managed Care – PPO | Admitting: Family Medicine

## 2013-10-23 ENCOUNTER — Encounter: Payer: Self-pay | Admitting: Family Medicine

## 2013-10-23 VITALS — BP 114/74 | HR 86 | Temp 98.2°F | Ht 66.25 in | Wt 145.0 lb

## 2013-10-23 DIAGNOSIS — Z0000 Encounter for general adult medical examination without abnormal findings: Secondary | ICD-10-CM

## 2013-10-23 DIAGNOSIS — M545 Low back pain, unspecified: Secondary | ICD-10-CM

## 2013-10-23 DIAGNOSIS — H538 Other visual disturbances: Secondary | ICD-10-CM

## 2013-10-23 LAB — POCT URINALYSIS DIPSTICK
Bilirubin, UA: NEGATIVE
Blood, UA: NEGATIVE
GLUCOSE UA: NEGATIVE
KETONES UA: NEGATIVE
LEUKOCYTES UA: NEGATIVE
Nitrite, UA: NEGATIVE
PROTEIN UA: NEGATIVE
SPEC GRAV UA: 1.015
Urobilinogen, UA: 0.2
pH, UA: 6.5

## 2013-10-23 NOTE — Patient Instructions (Signed)
Preventive Care for Adults A healthy lifestyle and preventive care can promote health and wellness. Preventive health guidelines for women include the following key practices.  A routine yearly physical is a good way to check with your health care provider about your health and preventive screening. It is a chance to share any concerns and updates on your health and to receive a thorough exam.  Visit your dentist for a routine exam and preventive care every 6 months. Brush your teeth twice a day and floss once a day. Good oral hygiene prevents tooth decay and gum disease.  The frequency of eye exams is based on your age, health, family medical history, use of contact lenses, and other factors. Follow your health care provider's recommendations for frequency of eye exams.  Eat a healthy diet. Foods like vegetables, fruits, whole grains, low-fat dairy products, and lean protein foods contain the nutrients you need without too many calories. Decrease your intake of foods high in solid fats, added sugars, and salt. Eat the right amount of calories for you.Get information about a proper diet from your health care provider, if necessary.  Regular physical exercise is one of the most important things you can do for your health. Most adults should get at least 150 minutes of moderate-intensity exercise (any activity that increases your heart rate and causes you to sweat) each week. In addition, most adults need muscle-strengthening exercises on 2 or more days a week.  Maintain a healthy weight. The body mass index (BMI) is a screening tool to identify possible weight problems. It provides an estimate of body fat based on height and weight. Your health care provider can find your BMI, and can help you achieve or maintain a healthy weight.For adults 20 years and older:  A BMI below 18.5 is considered underweight.  A BMI of 18.5 to 24.9 is normal.  A BMI of 25 to 29.9 is considered overweight.  A BMI of  30 and above is considered obese.  Maintain normal blood lipids and cholesterol levels by exercising and minimizing your intake of saturated fat. Eat a balanced diet with plenty of fruit and vegetables. Blood tests for lipids and cholesterol should begin at age 52 and be repeated every 5 years. If your lipid or cholesterol levels are high, you are over 50, or you are at high risk for heart disease, you may need your cholesterol levels checked more frequently.Ongoing high lipid and cholesterol levels should be treated with medicines if diet and exercise are not working.  If you smoke, find out from your health care provider how to quit. If you do not use tobacco, do not start.  Lung cancer screening is recommended for adults aged 37-80 years who are at high risk for developing lung cancer because of a history of smoking. A yearly low-dose CT scan of the lungs is recommended for people who have at least a 30-pack-year history of smoking and are a current smoker or have quit within the past 15 years. A pack year of smoking is smoking an average of 1 pack of cigarettes a day for 1 year (for example: 1 pack a day for 30 years or 2 packs a day for 15 years). Yearly screening should continue until the smoker has stopped smoking for at least 15 years. Yearly screening should be stopped for people who develop a health problem that would prevent them from having lung cancer treatment.  If you are pregnant, do not drink alcohol. If you are breastfeeding,  be very cautious about drinking alcohol. If you are not pregnant and choose to drink alcohol, do not have more than 1 drink per day. One drink is considered to be 12 ounces (355 mL) of beer, 5 ounces (148 mL) of wine, or 1.5 ounces (44 mL) of liquor.  Avoid use of street drugs. Do not share needles with anyone. Ask for help if you need support or instructions about stopping the use of drugs.  High blood pressure causes heart disease and increases the risk of  stroke. Your blood pressure should be checked at least every 1 to 2 years. Ongoing high blood pressure should be treated with medicines if weight loss and exercise do not work.  If you are 75-52 years old, ask your health care provider if you should take aspirin to prevent strokes.  Diabetes screening involves taking a blood sample to check your fasting blood sugar level. This should be done once every 3 years, after age 15, if you are within normal weight and without risk factors for diabetes. Testing should be considered at a younger age or be carried out more frequently if you are overweight and have at least 1 risk factor for diabetes.  Breast cancer screening is essential preventive care for women. You should practice "breast self-awareness." This means understanding the normal appearance and feel of your breasts and may include breast self-examination. Any changes detected, no matter how small, should be reported to a health care provider. Women in their 58s and 30s should have a clinical breast exam (CBE) by a health care provider as part of a regular health exam every 1 to 3 years. After age 16, women should have a CBE every year. Starting at age 53, women should consider having a mammogram (breast X-ray test) every year. Women who have a family history of breast cancer should talk to their health care provider about genetic screening. Women at a high risk of breast cancer should talk to their health care providers about having an MRI and a mammogram every year.  Breast cancer gene (BRCA)-related cancer risk assessment is recommended for women who have family members with BRCA-related cancers. BRCA-related cancers include breast, ovarian, tubal, and peritoneal cancers. Having family members with these cancers may be associated with an increased risk for harmful changes (mutations) in the breast cancer genes BRCA1 and BRCA2. Results of the assessment will determine the need for genetic counseling and  BRCA1 and BRCA2 testing.  Routine pelvic exams to screen for cancer are no longer recommended for nonpregnant women who are considered low risk for cancer of the pelvic organs (ovaries, uterus, and vagina) and who do not have symptoms. Ask your health care provider if a screening pelvic exam is right for you.  If you have had past treatment for cervical cancer or a condition that could lead to cancer, you need Pap tests and screening for cancer for at least 20 years after your treatment. If Pap tests have been discontinued, your risk factors (such as having a new sexual partner) need to be reassessed to determine if screening should be resumed. Some women have medical problems that increase the chance of getting cervical cancer. In these cases, your health care provider may recommend more frequent screening and Pap tests.  The HPV test is an additional test that may be used for cervical cancer screening. The HPV test looks for the virus that can cause the cell changes on the cervix. The cells collected during the Pap test can be  tested for HPV. The HPV test could be used to screen women aged 47 years and older, and should be used in women of any age who have unclear Pap test results. After the age of 36, women should have HPV testing at the same frequency as a Pap test.  Colorectal cancer can be detected and often prevented. Most routine colorectal cancer screening begins at the age of 38 years and continues through age 58 years. However, your health care provider may recommend screening at an earlier age if you have risk factors for colon cancer. On a yearly basis, your health care provider may provide home test kits to check for hidden blood in the stool. Use of a small camera at the end of a tube, to directly examine the colon (sigmoidoscopy or colonoscopy), can detect the earliest forms of colorectal cancer. Talk to your health care provider about this at age 64, when routine screening begins. Direct  exam of the colon should be repeated every 5-10 years through age 21 years, unless early forms of pre-cancerous polyps or small growths are found.  People who are at an increased risk for hepatitis B should be screened for this virus. You are considered at high risk for hepatitis B if:  You were born in a country where hepatitis B occurs often. Talk with your health care provider about which countries are considered high risk.  Your parents were born in a high-risk country and you have not received a shot to protect against hepatitis B (hepatitis B vaccine).  You have HIV or AIDS.  You use needles to inject street drugs.  You live with, or have sex with, someone who has Hepatitis B.  You get hemodialysis treatment.  You take certain medicines for conditions like cancer, organ transplantation, and autoimmune conditions.  Hepatitis C blood testing is recommended for all people born from [birth year] through 1965 and any individual with known risks for hepatitis C.  Practice safe sex. Use condoms and avoid high-risk sexual practices to reduce the spread of sexually transmitted infections (STIs). STIs include gonorrhea, chlamydia, syphilis, trichomonas, herpes, HPV, and human immunodeficiency virus (HIV). Herpes, HIV, and HPV are viral illnesses that have no cure. They can result in disability, cancer, and death.  You should be screened for sexually transmitted illnesses (STIs) including gonorrhea and chlamydia if:  You are sexually active and are younger than 24 years.  You are older than 24 years and your health care provider tells you that you are at risk for this type of infection.  Your sexual activity has changed since you were last screened and you are at an increased risk for chlamydia or gonorrhea. Ask your health care provider if you are at risk.  If you are at risk of being infected with HIV, it is recommended that you take a prescription medicine daily to prevent HIV infection. This is  called preexposure prophylaxis (PrEP). You are considered at risk if:  You are a heterosexual woman, are sexually active, and are at increased risk for HIV infection.  You take drugs by injection.  You are sexually active with a partner who has HIV.  Talk with your health care provider about whether you are at high risk of being infected with HIV. If you choose to begin PrEP, you should first be tested for HIV. You should then be tested every 3 months for as long as you are taking PrEP.  Osteoporosis is a disease in which the bones lose minerals and strength  with aging. This can result in serious bone fractures or breaks. The risk of osteoporosis can be identified using a bone density scan. Women ages 65 years and over and women at risk for fractures or osteoporosis should discuss screening with their health care providers. Ask your health care provider whether you should take a calcium supplement or vitamin D to reduce the rate of osteoporosis.  Menopause can be associated with physical symptoms and risks. Hormone replacement therapy is available to decrease symptoms and risks. You should talk to your health care provider about whether hormone replacement therapy is right for you.  Use sunscreen. Apply sunscreen liberally and repeatedly throughout the day. You should seek shade when your shadow is shorter than you. Protect yourself by wearing long sleeves, pants, a wide-brimmed hat, and sunglasses year round, whenever you are outdoors.  Once a month, do a whole body skin exam, using a mirror to look at the skin on your back. Tell your health care provider of new moles, moles that have irregular borders, moles that are larger than a pencil eraser, or moles that have changed in shape or color.  Stay current with required vaccines (immunizations).  Influenza vaccine. All adults should be immunized every year.  Tetanus, diphtheria, and acellular pertussis (Td, Tdap) vaccine. Pregnant women should  receive 1 dose of Tdap vaccine during each pregnancy. The dose should be obtained regardless of the length of time since the last dose. Immunization is preferred during the 27th-36th week of gestation. An adult who has not previously received Tdap or who does not know her vaccine status should receive 1 dose of Tdap. This initial dose should be followed by tetanus and diphtheria toxoids (Td) booster doses every 10 years. Adults with an unknown or incomplete history of completing a 3-dose immunization series with Td-containing vaccines should begin or complete a primary immunization series including a Tdap dose. Adults should receive a Td booster every 10 years.  Varicella vaccine. An adult without evidence of immunity to varicella should receive 2 doses or a second dose if she has previously received 1 dose. Pregnant females who do not have evidence of immunity should receive the first dose after pregnancy. This first dose should be obtained before leaving the health care facility. The second dose should be obtained 4-8 weeks after the first dose.  Human papillomavirus (HPV) vaccine. Females aged 13-26 years who have not received the vaccine previously should obtain the 3-dose series. The vaccine is not recommended for use in pregnant females. However, pregnancy testing is not needed before receiving a dose. If a female is found to be pregnant after receiving a dose, no treatment is needed. In that case, the remaining doses should be delayed until after the pregnancy. Immunization is recommended for any person with an immunocompromised condition through the age of 26 years if she did not get any or all doses earlier. During the 3-dose series, the second dose should be obtained 4-8 weeks after the first dose. The third dose should be obtained 24 weeks after the first dose and 16 weeks after the second dose.  Zoster vaccine. One dose is recommended for adults aged 60 years or older unless certain conditions are  present.  Measles, mumps, and rubella (MMR) vaccine. Adults born before 1957 generally are considered immune to measles and mumps. Adults born in 1957 or later should have 1 or more doses of MMR vaccine unless there is a contraindication to the vaccine or there is laboratory evidence of immunity to   each of the three diseases. A routine second dose of MMR vaccine should be obtained at least 28 days after the first dose for students attending postsecondary schools, health care workers, or international travelers. People who received inactivated measles vaccine or an unknown type of measles vaccine during 1963-1967 should receive 2 doses of MMR vaccine. People who received inactivated mumps vaccine or an unknown type of mumps vaccine before 1979 and are at high risk for mumps infection should consider immunization with 2 doses of MMR vaccine. For females of childbearing age, rubella immunity should be determined. If there is no evidence of immunity, females who are not pregnant should be vaccinated. If there is no evidence of immunity, females who are pregnant should delay immunization until after pregnancy. Unvaccinated health care workers born before 1957 who lack laboratory evidence of measles, mumps, or rubella immunity or laboratory confirmation of disease should consider measles and mumps immunization with 2 doses of MMR vaccine or rubella immunization with 1 dose of MMR vaccine.  Pneumococcal 13-valent conjugate (PCV13) vaccine. When indicated, a person who is uncertain of her immunization history and has no record of immunization should receive the PCV13 vaccine. An adult aged 19 years or older who has certain medical conditions and has not been previously immunized should receive 1 dose of PCV13 vaccine. This PCV13 should be followed with a dose of pneumococcal polysaccharide (PPSV23) vaccine. The PPSV23 vaccine dose should be obtained at least 8 weeks after the dose of PCV13 vaccine. An adult aged 19  years or older who has certain medical conditions and previously received 1 or more doses of PPSV23 vaccine should receive 1 dose of PCV13. The PCV13 vaccine dose should be obtained 1 or more years after the last PPSV23 vaccine dose.  Pneumococcal polysaccharide (PPSV23) vaccine. When PCV13 is also indicated, PCV13 should be obtained first. All adults aged 65 years and older should be immunized. An adult younger than age 65 years who has certain medical conditions should be immunized. Any person who resides in a nursing home or long-term care facility should be immunized. An adult smoker should be immunized. People with an immunocompromised condition and certain other conditions should receive both PCV13 and PPSV23 vaccines. People with human immunodeficiency virus (HIV) infection should be immunized as soon as possible after diagnosis. Immunization during chemotherapy or radiation therapy should be avoided. Routine use of PPSV23 vaccine is not recommended for American Indians, [place] Natives, or people younger than 65 years unless there are medical conditions that require PPSV23 vaccine. When indicated, people who have unknown immunization and have no record of immunization should receive PPSV23 vaccine. One-time revaccination 5 years after the first dose of PPSV23 is recommended for people aged 19-64 years who have chronic kidney failure, nephrotic syndrome, asplenia, or immunocompromised conditions. People who received 1-2 doses of PPSV23 before age 65 years should receive another dose of PPSV23 vaccine at age 65 years or later if at least 5 years have passed since the previous dose. Doses of PPSV23 are not needed for people immunized with PPSV23 at or after age 65 years.  Meningococcal vaccine. Adults with asplenia or persistent complement component deficiencies should receive 2 doses of quadrivalent meningococcal conjugate (MenACWY-D) vaccine. The doses should be obtained at least 2 months apart.  Microbiologists working with certain meningococcal bacteria, military recruits, people at risk during an outbreak, and people who travel to or live in countries with a high rate of meningitis should be immunized. A first-year college student up through age   21 years who is living in a residence hall should receive a dose if she did not receive a dose on or after her 16th birthday. Adults who have certain high-risk conditions should receive one or more doses of vaccine.  Hepatitis A vaccine. Adults who wish to be protected from this disease, have certain high-risk conditions, work with hepatitis A-infected animals, work in hepatitis A research labs, or travel to or work in countries with a high rate of hepatitis A should be immunized. Adults who were previously unvaccinated and who anticipate close contact with an international adoptee during the first 60 days after arrival in the [place] from a country with a high rate of hepatitis A should be immunized.  Hepatitis B vaccine. Adults who wish to be protected from this disease, have certain high-risk conditions, may be exposed to blood or other infectious body fluids, are household contacts or sex partners of hepatitis B positive people, are clients or workers in certain care facilities, or travel to or work in countries with a high rate of hepatitis B should be immunized.  Haemophilus influenzae type b (Hib) vaccine. A previously unvaccinated person with asplenia or sickle cell disease or having a scheduled splenectomy should receive 1 dose of Hib vaccine. Regardless of previous immunization, a recipient of a hematopoietic stem cell transplant should receive a 3-dose series 6-12 months after her successful transplant. Hib vaccine is not recommended for adults with HIV infection. Preventive Services / Frequency Ages 43 to 39years  Blood pressure check.** / Every 1 to 2 years.  Lipid and cholesterol check.** / Every 5 years beginning at age  75.  Clinical breast exam.** / Every 3 years for women in their 32s and 74s.  BRCA-related cancer risk assessment.** / For women who have family members with a BRCA-related cancer (breast, ovarian, tubal, or peritoneal cancers).  Pap test.** / Every 2 years from ages 65 through 91. Every 3 years starting at age 34 through age 93 or 72 with a history of 3 consecutive normal Pap tests.  HPV screening.** / Every 3 years from ages 46 through ages 53 to 26 with a history of 3 consecutive normal Pap tests.  Hepatitis C blood test.** / For any individual with known risks for hepatitis C.  Skin self-exam. / Monthly.  Influenza vaccine. / Every year.  Tetanus, diphtheria, and acellular pertussis (Tdap, Td) vaccine.** / Consult your health care provider. Pregnant women should receive 1 dose of Tdap vaccine during each pregnancy. 1 dose of Td every 10 years.  Varicella vaccine.** / Consult your health care provider. Pregnant females who do not have evidence of immunity should receive the first dose after pregnancy.  HPV vaccine. / 3 doses over 6 months, if 70 and younger. The vaccine is not recommended for use in pregnant females. However, pregnancy testing is not needed before receiving a dose.  Measles, mumps, rubella (MMR) vaccine.** / You need at least 1 dose of MMR if you were born in 1957 or later. You may also need a 2nd dose. For females of childbearing age, rubella immunity should be determined. If there is no evidence of immunity, females who are not pregnant should be vaccinated. If there is no evidence of immunity, females who are pregnant should delay immunization until after pregnancy.  Pneumococcal 13-valent conjugate (PCV13) vaccine.** / Consult your health care provider.  Pneumococcal polysaccharide (PPSV23) vaccine.** / 1 to 2 doses if you smoke cigarettes or if you have certain conditions.  Meningococcal vaccine.** /  1 dose if you are age 70 to 51 years and a [place] living in a residence hall, or have one of several medical conditions, you need to get vaccinated against meningococcal disease. You may also need additional booster doses.  Hepatitis A vaccine.** / Consult your health care provider.  Hepatitis B vaccine.** / Consult your health care provider.  Haemophilus influenzae type b (Hib) vaccine.** / Consult your health care provider. Ages 40 to 64years  Blood pressure check.** / Every 1 to 2 years.  Lipid and cholesterol check.** / Every 5 years beginning at age 58 years.  Lung cancer screening. / Every year if you are aged 56-80 years and have a 30-pack-year history of smoking and currently smoke or have quit within the past 15 years. Yearly screening is stopped once you have quit smoking for at least 15 years or develop a health problem that would prevent you from having lung cancer treatment.  Clinical breast exam.** / Every year after age 35 years.  BRCA-related cancer risk assessment.** / For women who have family members with a BRCA-related cancer (breast, ovarian, tubal, or peritoneal cancers).  Mammogram.** / Every year beginning at age 109 years and continuing for as long as you are in good health. Consult with your health care provider.  Pap test.** / Every 3 years starting at age 44 years through age 94 or 70 years with a history of 3 consecutive normal Pap tests.  HPV screening.** / Every 3 years from ages 109 years through ages 50 to 30 years with a history of 3 consecutive normal Pap tests.  Fecal occult blood test (FOBT) of stool. / Every year beginning at age 73 years and continuing until age 59 years. You may not need to do this test if you get a colonoscopy every 10 years.  Flexible sigmoidoscopy or colonoscopy.** / Every 5 years for a flexible sigmoidoscopy or every 10 years for a colonoscopy beginning at age 68 years and continuing until age 12 years.  Hepatitis C blood test.** / For all people born from [birth year] through  1965 and any individual with known risks for hepatitis C.  Skin self-exam. / Monthly.  Influenza vaccine. / Every year.  Tetanus, diphtheria, and acellular pertussis (Tdap/Td) vaccine.** / Consult your health care provider. Pregnant women should receive 1 dose of Tdap vaccine during each pregnancy. 1 dose of Td every 10 years.  Varicella vaccine.** / Consult your health care provider. Pregnant females who do not have evidence of immunity should receive the first dose after pregnancy.  Zoster vaccine.** / 1 dose for adults aged 2 years or older.  Measles, mumps, rubella (MMR) vaccine.** / You need at least 1 dose of MMR if you were born in 1957 or later. You may also need a 2nd dose. For females of childbearing age, rubella immunity should be determined. If there is no evidence of immunity, females who are not pregnant should be vaccinated. If there is no evidence of immunity, females who are pregnant should delay immunization until after pregnancy.  Pneumococcal 13-valent conjugate (PCV13) vaccine.** / Consult your health care provider.  Pneumococcal polysaccharide (PPSV23) vaccine.** / 1 to 2 doses if you smoke cigarettes or if you have certain conditions.  Meningococcal vaccine.** / Consult your health care provider.  Hepatitis A vaccine.** / Consult your health care provider.  Hepatitis B vaccine.** / Consult your health care provider.  Haemophilus influenzae type b (Hib) vaccine.** / Consult your health care provider. Ages 48 years  and over  Blood pressure check.** / Every 1 to 2 years.  Lipid and cholesterol check.** / Every 5 years beginning at age 84 years.  Lung cancer screening. / Every year if you are aged 50-80 years and have a 30-pack-year history of smoking and currently smoke or have quit within the past 15 years. Yearly screening is stopped once you have quit smoking for at least 15 years or develop a health problem that would prevent you from having lung cancer  treatment.  Clinical breast exam.** / Every year after age 24 years.  BRCA-related cancer risk assessment.** / For women who have family members with a BRCA-related cancer (breast, ovarian, tubal, or peritoneal cancers).  Mammogram.** / Every year beginning at age 14 years and continuing for as long as you are in good health. Consult with your health care provider.  Pap test.** / Every 3 years starting at age 17 years through age 31 or 74 years with 3 consecutive normal Pap tests. Testing can be stopped between 65 and 70 years with 3 consecutive normal Pap tests and no abnormal Pap or HPV tests in the past 10 years.  HPV screening.** / Every 3 years from ages 30 years through ages 70 or 28 years with a history of 3 consecutive normal Pap tests. Testing can be stopped between 65 and 70 years with 3 consecutive normal Pap tests and no abnormal Pap or HPV tests in the past 10 years.  Fecal occult blood test (FOBT) of stool. / Every year beginning at age 64 years and continuing until age 92 years. You may not need to do this test if you get a colonoscopy every 10 years.  Flexible sigmoidoscopy or colonoscopy.** / Every 5 years for a flexible sigmoidoscopy or every 10 years for a colonoscopy beginning at age 73 years and continuing until age 39 years.  Hepatitis C blood test.** / For all people born from [birth year] through 1963 and any individual with known risks for hepatitis C.  Osteoporosis screening.** / A one-time screening for women ages 35 years and over and women at risk for fractures or osteoporosis.  Skin self-exam. / Monthly.  Influenza vaccine. / Every year.  Tetanus, diphtheria, and acellular pertussis (Tdap/Td) vaccine.** / 1 dose of Td every 10 years.  Varicella vaccine.** / Consult your health care provider.  Zoster vaccine.** / 1 dose for adults aged 59 years or older.  Pneumococcal 13-valent conjugate (PCV13) vaccine.** / Consult your health care provider.  Pneumococcal  polysaccharide (PPSV23) vaccine.** / 1 dose for all adults aged 8 years and older.  Meningococcal vaccine.** / Consult your health care provider.  Hepatitis A vaccine.** / Consult your health care provider.  Hepatitis B vaccine.** / Consult your health care provider.  Haemophilus influenzae type b (Hib) vaccine.** / Consult your health care provider. ** Family history and personal history of risk and conditions may change your health care provider's recommendations. Document Released: 05/26/2001 Document Revised: 04/04/2013 Document Reviewed: 08/25/2010 [facility] Patient Information 2015 [place], [place]. This information is not intended to replace advice given to you by your health care provider. Make sure you discuss any questions you have with your health care provider.
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
## 2013-10-23 NOTE — Progress Notes (Signed)
Pre visit review using our clinic review tool, if applicable. No additional management support is needed unless otherwise documented below in the visit note. 

## 2013-10-24 LAB — HEPATIC FUNCTION PANEL
ALBUMIN: 4.4 g/dL (ref 3.5–5.2)
ALT: 14 U/L (ref 0–35)
AST: 19 U/L (ref 0–37)
Alkaline Phosphatase: 55 U/L (ref 39–117)
Bilirubin, Direct: 0.1 mg/dL (ref 0.0–0.3)
TOTAL PROTEIN: 7.2 g/dL (ref 6.0–8.3)
Total Bilirubin: 0.6 mg/dL (ref 0.2–1.2)

## 2013-10-24 LAB — TSH: TSH: 0.38 u[IU]/mL (ref 0.35–4.50)

## 2013-10-24 LAB — CBC WITH DIFFERENTIAL/PLATELET
BASOS PCT: 0.6 % (ref 0.0–3.0)
Basophils Absolute: 0 10*3/uL (ref 0.0–0.1)
EOS PCT: 2.3 % (ref 0.0–5.0)
Eosinophils Absolute: 0.1 10*3/uL (ref 0.0–0.7)
HEMATOCRIT: 40 % (ref 36.0–46.0)
HEMOGLOBIN: 13.6 g/dL (ref 12.0–15.0)
LYMPHS ABS: 1.4 10*3/uL (ref 0.7–4.0)
Lymphocytes Relative: 31.7 % (ref 12.0–46.0)
MCHC: 34 g/dL (ref 30.0–36.0)
MCV: 92.2 fl (ref 78.0–100.0)
MONO ABS: 0.2 10*3/uL (ref 0.1–1.0)
MONOS PCT: 5.4 % (ref 3.0–12.0)
NEUTROS ABS: 2.6 10*3/uL (ref 1.4–7.7)
Neutrophils Relative %: 60 % (ref 43.0–77.0)
PLATELETS: 258 10*3/uL (ref 150.0–400.0)
RBC: 4.34 Mil/uL (ref 3.87–5.11)
RDW: 13.2 % (ref 11.5–15.5)
WBC: 4.3 10*3/uL (ref 4.0–10.5)

## 2013-10-24 LAB — LIPID PANEL
CHOLESTEROL: 229 mg/dL — AB (ref 0–200)
HDL: 67.9 mg/dL (ref >39.00)
LDL CALC: 148 mg/dL — AB (ref 0–99)
NonHDL: 161.1
TRIGLYCERIDES: 65 mg/dL (ref 0.0–149.0)
Total CHOL/HDL Ratio: 3
VLDL: 13 mg/dL (ref 0.0–40.0)

## 2013-10-24 LAB — BASIC METABOLIC PANEL
BUN: 18 mg/dL (ref 6–23)
CHLORIDE: 102 meq/L (ref 96–112)
CO2: 30 mEq/L (ref 19–32)
Calcium: 9.8 mg/dL (ref 8.4–10.5)
Creatinine, Ser: 0.8 mg/dL (ref 0.4–1.2)
GFR: 89.84 mL/min (ref >60.00)
Glucose, Bld: 97 mg/dL (ref 70–99)
POTASSIUM: 3.9 meq/L (ref 3.5–5.1)
SODIUM: 139 meq/L (ref 135–145)

## 2013-10-25 ENCOUNTER — Ambulatory Visit
Admission: RE | Admit: 2013-10-25 | Discharge: 2013-10-25 | Disposition: A | Discharge status: Home / Self Care | Payer: BC Managed Care – PPO | Source: Ambulatory Visit | Attending: Family Medicine | Admitting: Family Medicine

## 2013-10-25 DIAGNOSIS — M545 Low back pain, unspecified: Secondary | ICD-10-CM

## 2013-11-01 ENCOUNTER — Telehealth: Payer: Self-pay | Admitting: Cardiology

## 2013-11-01 NOTE — Telephone Encounter (Signed)
Although she has no valvular heart disease she does have a questionable remote history of bacterial endocarditis so I would recommend SBE prophylaxis

## 2013-11-01 NOTE — Telephone Encounter (Signed)
Patient wants to know if she needs antibiotics prior to a dental appointment. Please call and advise.

## 2013-11-01 NOTE — Telephone Encounter (Signed)
To Dr Turner to advise 

## 2013-11-02 NOTE — Telephone Encounter (Signed)
Made pt aware via vm

## 2013-11-02 NOTE — Telephone Encounter (Signed)
lmtrc

## 2013-12-06 ENCOUNTER — Telehealth: Payer: Self-pay | Admitting: Family Medicine

## 2013-12-06 MED ORDER — ELETRIPTAN HYDROBROMIDE 40 MG PO TABS: 40.0000 mg | ORAL_TABLET | ORAL | Status: DC | PRN

## 2013-12-06 NOTE — Telephone Encounter (Signed)
Rx faxed.    KP 

## 2013-12-06 NOTE — Telephone Encounter (Signed)
Caller name: [place]  Relation to pt: self Call back [phone]   Reason for call:   requesting a refill eletriptan (RELPAX) 40 MG tablet.

## 2013-12-06 NOTE — Telephone Encounter (Signed)
Refill x1,  2 refills 

## 2014-03-19 ENCOUNTER — Ambulatory Visit (INDEPENDENT_AMBULATORY_CARE_PROVIDER_SITE_OTHER): Payer: BC Managed Care – PPO | Admitting: Family Medicine

## 2014-03-19 ENCOUNTER — Encounter: Payer: Self-pay | Admitting: Family Medicine

## 2014-03-19 VITALS — BP 100/66 | HR 62 | Temp 98.6°F | Wt 152.2 lb

## 2014-03-19 DIAGNOSIS — M5441 Lumbago with sciatica, right side: Secondary | ICD-10-CM

## 2014-03-19 DIAGNOSIS — M79672 Pain in left foot: Secondary | ICD-10-CM

## 2014-03-19 MED ORDER — CYCLOBENZAPRINE HCL 10 MG PO TABS: 10.0000 mg | ORAL_TABLET | Freq: Three times a day (TID) | ORAL | Status: DC | PRN

## 2014-03-19 MED ORDER — NAPROXEN 500 MG PO TABS
500.0000 mg | ORAL_TABLET | Freq: Two times a day (BID) | ORAL | Status: DC
Start: 2014-03-19 — End: 2014-12-06

## 2014-03-19 NOTE — Progress Notes (Signed)
Pre visit review using our clinic review tool, if applicable. No additional management support is needed unless otherwise documented below in the visit note. 

## 2014-03-19 NOTE — Patient Instructions (Signed)
Back Pain, Adult Low back pain is very common. About 1 in 5 people have back pain.The cause of low back pain is rarely dangerous. The pain often gets better over time.About half of people with a sudden onset of back pain feel better in just 2 weeks. About 8 in 10 people feel better by 6 weeks.  CAUSES Some common causes of back pain include:  Strain of the muscles or ligaments supporting the spine.  Wear and tear (degeneration) of the spinal discs.  Arthritis.  Direct injury to the back. DIAGNOSIS Most of the time, the direct cause of low back pain is not known.However, back pain can be treated effectively even when the exact cause of the pain is unknown.Answering your caregiver's questions about your overall health and symptoms is one of the most accurate ways to make sure the cause of your pain is not dangerous. If your caregiver needs more information, he or she may order lab work or imaging tests (X-rays or MRIs).However, even if imaging tests show changes in your back, this usually does not require surgery. HOME CARE INSTRUCTIONS For many people, back pain returns.Since low back pain is rarely dangerous, it is often a condition that people can learn to manageon their own.   Remain active. It is stressful on the back to sit or stand in one place. Do not sit, drive, or stand in one place for more than 30 minutes at a time. Take short walks on level surfaces as soon as pain allows.Try to increase the length of time you walk each day.  Do not stay in bed.Resting more than 1 or 2 days can delay your recovery.  Do not avoid exercise or work.Your body is made to move.It is not dangerous to be active, even though your back may hurt.Your back will likely heal faster if you return to being active before your pain is gone.  Pay attention to your body when you bend and lift. Many people have less discomfortwhen lifting if they bend their knees, keep the load close to their bodies,and  avoid twisting. Often, the most comfortable positions are those that put less stress on your recovering back.  Find a comfortable position to sleep. Use a firm mattress and lie on your side with your knees slightly bent. If you lie on your back, put a pillow under your knees.  Only take over-the-counter or prescription medicines as directed by your caregiver. Over-the-counter medicines to reduce pain and inflammation are often the most helpful.Your caregiver may prescribe muscle relaxant drugs.These medicines help dull your pain so you can more quickly return to your normal activities and healthy exercise.  Put ice on the injured area.  Put ice in a plastic bag.  Place a towel between your skin and the bag.  Leave the ice on for 15-20 minutes, 03-04 times a day for the first 2 to 3 days. After that, ice and heat may be alternated to reduce pain and spasms.  Ask your caregiver about trying back exercises and gentle massage. This may be of some benefit.  Avoid feeling anxious or stressed.Stress increases muscle tension and can worsen back pain.It is important to recognize when you are anxious or stressed and learn ways to manage it.Exercise is a great option. SEEK MEDICAL CARE IF:  You have pain that is not relieved with rest or medicine.  You have pain that does not improve in 1 week.  You have new symptoms.  You are generally not feeling well. SEEK   IMMEDIATE MEDICAL CARE IF:   You have pain that radiates from your back into your legs.  You develop new bowel or bladder control problems.  You have unusual weakness or numbness in your arms or legs.  You develop nausea or vomiting.  You develop abdominal pain.  You feel faint. Document Released: 03/30/2005 Document Revised: 09/29/2011 Document Reviewed: 08/01/2013 [employer] Patient Information 2015 [employer]. This information is not intended to replace advice given to you by your health care provider. Make sure you  discuss any questions you have with your health care provider.  
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
## 2014-04-02 ENCOUNTER — Ambulatory Visit: Payer: BC Managed Care – PPO | Admitting: Family Medicine

## 2014-04-30 ENCOUNTER — Other Ambulatory Visit: Payer: Self-pay | Admitting: Family Medicine

## 2014-04-30 DIAGNOSIS — R030 Elevated blood-pressure reading, without diagnosis of hypertension: Secondary | ICD-10-CM

## 2014-04-30 MED ORDER — HYDROCHLOROTHIAZIDE 12.5 MG PO CAPS: 12.5000 mg | ORAL_CAPSULE | Freq: Every day | ORAL | Status: DC

## 2014-04-30 MED ORDER — ELETRIPTAN HYDROBROMIDE 40 MG PO TABS: 40.0000 mg | ORAL_TABLET | ORAL | Status: DC | PRN

## 2014-04-30 NOTE — Telephone Encounter (Signed)
Caller name: Swetha Relation to pt: self Call back number: [phone] [facility] Will call back with pharmacy info  Reason for call:   Patient is requesting a refill of hydrochlorizide. She states that dr. Linna Darner has prescribed this in the past.

## 2014-04-30 NOTE — Telephone Encounter (Signed)
Last seen 03/19/14. Patient transferred from [place]. Please advise     KP

## 2014-04-30 NOTE — Telephone Encounter (Signed)
Caller name: [name], [name] Relation to pt: self  Call back number: [phone] [facility] [phone]  Reason for call:  Pt called back to inform [facility] [phone]   pt is also requesting a refill eletriptan (RELPAX) 40 MG tablet [facility] [phone]

## 2014-05-01 ENCOUNTER — Ambulatory Visit: Payer: BC Managed Care – PPO | Admitting: Family Medicine

## 2014-05-10 ENCOUNTER — Ambulatory Visit
Admission: RE | Admit: 2014-05-10 | Discharge: 2014-05-10 | Disposition: A | Discharge status: Home / Self Care | Payer: BC Managed Care – PPO | Source: Ambulatory Visit | Attending: Family Medicine | Admitting: Family Medicine

## 2014-05-10 DIAGNOSIS — M5441 Lumbago with sciatica, right side: Secondary | ICD-10-CM

## 2014-05-14 ENCOUNTER — Other Ambulatory Visit: Payer: Self-pay | Admitting: Obstetrics and Gynecology

## 2014-05-15 ENCOUNTER — Other Ambulatory Visit: Payer: Self-pay

## 2014-05-15 DIAGNOSIS — M48061 Spinal stenosis, lumbar region without neurogenic claudication: Secondary | ICD-10-CM

## 2014-05-15 DIAGNOSIS — M5136 Other intervertebral disc degeneration, lumbar region: Secondary | ICD-10-CM

## 2014-05-16 LAB — CYTOLOGY - PAP

## 2014-06-28 ENCOUNTER — Other Ambulatory Visit: Payer: Self-pay

## 2014-06-28 DIAGNOSIS — R030 Elevated blood-pressure reading, without diagnosis of hypertension: Secondary | ICD-10-CM

## 2014-06-28 MED ORDER — HYDROCHLOROTHIAZIDE 12.5 MG PO CAPS: 12.5000 mg | ORAL_CAPSULE | Freq: Every day | ORAL | Status: DC

## 2014-06-28 MED ORDER — ELETRIPTAN HYDROBROMIDE 40 MG PO TABS: 40.0000 mg | ORAL_TABLET | ORAL | Status: DC | PRN

## 2014-06-28 NOTE — Addendum Note (Signed)
Addended by: Ewing Schlein on: 06/28/2014 02:32 PM   Modules accepted: Orders

## 2014-09-26 ENCOUNTER — Telehealth: Payer: Self-pay | Admitting: Family Medicine

## 2014-09-26 DIAGNOSIS — M5136 Other intervertebral disc degeneration, lumbar region: Secondary | ICD-10-CM

## 2014-09-26 DIAGNOSIS — M48061 Spinal stenosis, lumbar region without neurogenic claudication: Secondary | ICD-10-CM

## 2014-09-26 NOTE — Telephone Encounter (Signed)
I did not see previous PT order in system.  Please advise.

## 2014-09-26 NOTE — Telephone Encounter (Signed)
Caller name: Trace Relation to pt: self Call back number: [phone] Pharmacy:  Reason for call:   Requesting PT order again. Patient was out of town last time ordered and could not go.  F [phone]

## 2014-09-26 NOTE — Telephone Encounter (Signed)
Pt was referred to ortho not Pt

## 2014-09-27 NOTE — Telephone Encounter (Signed)
Ortho referral placed again. Notified pt and she confirmed that she meant to say orthopedic.

## 2014-10-25 ENCOUNTER — Encounter: Payer: BC Managed Care – PPO | Admitting: Family Medicine

## 2014-11-02 ENCOUNTER — Other Ambulatory Visit: Payer: Self-pay | Admitting: Nurse Practitioner

## 2014-11-02 DIAGNOSIS — N63 Unspecified lump in unspecified breast: Secondary | ICD-10-CM

## 2014-11-07 ENCOUNTER — Ambulatory Visit
Admission: RE | Admit: 2014-11-07 | Discharge: 2014-11-07 | Disposition: A | Discharge status: Home / Self Care | Payer: BC Managed Care – PPO | Source: Ambulatory Visit | Attending: Nurse Practitioner | Admitting: Nurse Practitioner

## 2014-11-07 DIAGNOSIS — N63 Unspecified lump in unspecified breast: Secondary | ICD-10-CM

## 2014-12-06 ENCOUNTER — Other Ambulatory Visit (HOSPITAL_COMMUNITY)
Admission: RE | Admit: 2014-12-06 | Discharge: 2014-12-06 | Disposition: A | Discharge status: Home / Self Care | Payer: BC Managed Care – PPO | Source: Ambulatory Visit | Attending: Family Medicine | Admitting: Family Medicine

## 2014-12-06 ENCOUNTER — Encounter: Payer: Self-pay | Admitting: Family Medicine

## 2014-12-06 ENCOUNTER — Ambulatory Visit (INDEPENDENT_AMBULATORY_CARE_PROVIDER_SITE_OTHER): Payer: BC Managed Care – PPO | Admitting: Family Medicine

## 2014-12-06 VITALS — BP 120/78 | HR 64 | Temp 98.1°F | Wt 157.0 lb

## 2014-12-06 DIAGNOSIS — N760 Acute vaginitis: Secondary | ICD-10-CM | POA: Insufficient documentation

## 2014-12-06 DIAGNOSIS — R350 Frequency of micturition: Secondary | ICD-10-CM | POA: Diagnosis not present

## 2014-12-06 LAB — POCT URINALYSIS DIPSTICK
BILIRUBIN UA: NEGATIVE
Blood, UA: NEGATIVE
GLUCOSE UA: NEGATIVE
KETONES UA: NEGATIVE
Leukocytes, UA: NEGATIVE
Nitrite, UA: NEGATIVE
PROTEIN UA: NEGATIVE
SPEC GRAV UA: 1.02
Urobilinogen, UA: 2
pH, UA: 6

## 2014-12-06 NOTE — Progress Notes (Signed)
Pre visit review using our clinic review tool, if applicable. No additional management support is needed unless otherwise documented below in the visit note. 

## 2014-12-06 NOTE — Patient Instructions (Signed)
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
## 2014-12-06 NOTE — Progress Notes (Deleted)
   Subjective:    Patient ID: Christine Walker, female    DOB: 03/02/1951, 64 y.o.   MRN: [id number]  HPI    Review of Systems     Objective:   Physical Exam        Assessment & Plan:

## 2014-12-13 LAB — URINE CYTOLOGY ANCILLARY ONLY
Bacterial vaginitis: NEGATIVE
Candida vaginitis: NEGATIVE

## 2015-01-19 ENCOUNTER — Other Ambulatory Visit: Payer: Self-pay | Admitting: Family Medicine

## 2015-02-04 ENCOUNTER — Encounter: Payer: Self-pay | Admitting: Behavioral Health

## 2015-02-04 ENCOUNTER — Telehealth: Payer: Self-pay | Admitting: Behavioral Health

## 2015-02-04 NOTE — Telephone Encounter (Signed)
Pre-Visit Call completed with patient and chart updated.   Pre-Visit Info documented in Specialty Comments under [employer].    

## 2015-02-05 ENCOUNTER — Ambulatory Visit (INDEPENDENT_AMBULATORY_CARE_PROVIDER_SITE_OTHER): Payer: BC Managed Care – PPO | Admitting: Family Medicine

## 2015-02-05 ENCOUNTER — Encounter: Payer: Self-pay | Admitting: Family Medicine

## 2015-02-05 ENCOUNTER — Other Ambulatory Visit: Payer: Self-pay | Admitting: Family Medicine

## 2015-02-05 VITALS — BP 125/74 | HR 66 | Temp 98.3°F | Ht 66.0 in | Wt 157.2 lb

## 2015-02-05 DIAGNOSIS — I10 Essential (primary) hypertension: Secondary | ICD-10-CM | POA: Diagnosis not present

## 2015-02-05 DIAGNOSIS — E785 Hyperlipidemia, unspecified: Secondary | ICD-10-CM

## 2015-02-05 DIAGNOSIS — E049 Nontoxic goiter, unspecified: Secondary | ICD-10-CM | POA: Diagnosis not present

## 2015-02-05 DIAGNOSIS — R0789 Other chest pain: Secondary | ICD-10-CM

## 2015-02-05 DIAGNOSIS — Z0000 Encounter for general adult medical examination without abnormal findings: Secondary | ICD-10-CM

## 2015-02-05 HISTORY — DX: Essential (primary) hypertension: I10

## 2015-02-05 LAB — CBC WITH DIFFERENTIAL/PLATELET
BASOS ABS: 0 10*3/uL (ref 0.0–0.1)
BASOS PCT: 0.5 % (ref 0.0–3.0)
EOS ABS: 0.1 10*3/uL (ref 0.0–0.7)
EOS PCT: 2.9 % (ref 0.0–5.0)
HEMATOCRIT: 41.3 % (ref 36.0–46.0)
Hemoglobin: 14.1 g/dL (ref 12.0–15.0)
LYMPHS PCT: 37.9 % (ref 12.0–46.0)
Lymphs Abs: 1.4 10*3/uL (ref 0.7–4.0)
MCHC: 34.1 g/dL (ref 30.0–36.0)
MCV: 91.9 fl (ref 78.0–100.0)
Monocytes Absolute: 0.2 10*3/uL (ref 0.1–1.0)
Monocytes Relative: 6 % (ref 3.0–12.0)
Neutro Abs: 1.9 10*3/uL (ref 1.4–7.7)
Neutrophils Relative %: 52.7 % (ref 43.0–77.0)
PLATELETS: 270 10*3/uL (ref 150.0–400.0)
RBC: 4.5 Mil/uL (ref 3.87–5.11)
RDW: 13.3 % (ref 11.5–15.5)
WBC: 3.6 10*3/uL — ABNORMAL LOW (ref 4.0–10.5)

## 2015-02-05 LAB — COMPREHENSIVE METABOLIC PANEL
ALT: 21 U/L (ref 0–35)
AST: 23 U/L (ref 0–37)
Albumin: 4.3 g/dL (ref 3.5–5.2)
Alkaline Phosphatase: 54 U/L (ref 39–117)
BUN: 17 mg/dL (ref 6–23)
CO2: 33 meq/L — AB (ref 19–32)
CREATININE: 0.81 mg/dL (ref 0.40–1.20)
Calcium: 10 mg/dL (ref 8.4–10.5)
Chloride: 100 mEq/L (ref 96–112)
GFR: 92.02 mL/min (ref >60.00)
GLUCOSE: 126 mg/dL — AB (ref 70–99)
Potassium: 4.3 mEq/L (ref 3.5–5.1)
Sodium: 140 mEq/L (ref 135–145)
TOTAL PROTEIN: 7.3 g/dL (ref 6.0–8.3)
Total Bilirubin: 0.5 mg/dL (ref 0.2–1.2)

## 2015-02-05 LAB — POCT URINALYSIS DIPSTICK
Bilirubin, UA: NEGATIVE
Glucose, UA: NEGATIVE
Ketones, UA: NEGATIVE
Leukocytes, UA: NEGATIVE
NITRITE UA: NEGATIVE
Protein, UA: NEGATIVE
RBC UA: NEGATIVE
Spec Grav, UA: 1.025
UROBILINOGEN UA: 0.2
pH, UA: 6

## 2015-02-05 LAB — LIPID PANEL
Cholesterol: 239 mg/dL — ABNORMAL HIGH (ref 0–200)
HDL: 70.3 mg/dL (ref >39.00)
LDL Cholesterol: 155 mg/dL — ABNORMAL HIGH (ref 0–99)
NonHDL: 168.29
Total CHOL/HDL Ratio: 3
Triglycerides: 67 mg/dL (ref 0.0–149.0)
VLDL: 13.4 mg/dL (ref 0.0–40.0)

## 2015-02-05 LAB — HEPATITIS C ANTIBODY: HCV AB: NEGATIVE

## 2015-02-05 LAB — HIV ANTIBODY (ROUTINE TESTING W REFLEX): HIV 1&2 Ab, 4th Generation: NONREACTIVE

## 2015-02-05 LAB — TSH: TSH: 1.07 u[IU]/mL (ref 0.35–4.50)

## 2015-02-05 NOTE — Assessment & Plan Note (Signed)
con't hctz 

## 2015-02-05 NOTE — Patient Instructions (Signed)
Preventive Care for Adults, Female A healthy lifestyle and preventive care can promote health and wellness. Preventive health guidelines for women include the following key practices.  A routine yearly physical is a good way to check with your health care provider about your health and preventive screening. It is a chance to share any concerns and updates on your health and to receive a thorough exam.  Visit your dentist for a routine exam and preventive care every 6 months. Brush your teeth twice a day and floss once a day. Good oral hygiene prevents tooth decay and gum disease.  The frequency of eye exams is based on your age, health, family medical history, use of contact lenses, and other factors. Follow your health care provider's recommendations for frequency of eye exams.  Eat a healthy diet. Foods like vegetables, fruits, whole grains, low-fat dairy products, and lean protein foods contain the nutrients you need without too many calories. Decrease your intake of foods high in solid fats, added sugars, and salt. Eat the right amount of calories for you.Get information about a proper diet from your health care provider, if necessary.  Regular physical exercise is one of the most important things you can do for your health. Most adults should get at least 150 minutes of moderate-intensity exercise (any activity that increases your heart rate and causes you to sweat) each week. In addition, most adults need muscle-strengthening exercises on 2 or more days a week.  Maintain a healthy weight. The body mass index (BMI) is a screening tool to identify possible weight problems. It provides an estimate of body fat based on height and weight. Your health care provider can find your BMI and can help you achieve or maintain a healthy weight.For adults 20 years and older:  A BMI below 18.5 is considered underweight.  A BMI of 18.5 to 24.9 is normal.  A BMI of 25 to 29.9 is considered overweight.  A  BMI of 30 and above is considered obese.  Maintain normal blood lipids and cholesterol levels by exercising and minimizing your intake of saturated fat. Eat a balanced diet with plenty of fruit and vegetables. Blood tests for lipids and cholesterol should begin at age 45 and be repeated every 5 years. If your lipid or cholesterol levels are high, you are over 50, or you are at high risk for heart disease, you may need your cholesterol levels checked more frequently.Ongoing high lipid and cholesterol levels should be treated with medicines if diet and exercise are not working.  If you smoke, find out from your health care provider how to quit. If you do not use tobacco, do not start.  Lung cancer screening is recommended for adults aged 45-80 years who are at high risk for developing lung cancer because of a history of smoking. A yearly low-dose CT scan of the lungs is recommended for people who have at least a 30-pack-year history of smoking and are a current smoker or have quit within the past 15 years. A pack year of smoking is smoking an average of 1 pack of cigarettes a day for 1 year (for example: 1 pack a day for 30 years or 2 packs a day for 15 years). Yearly screening should continue until the smoker has stopped smoking for at least 15 years. Yearly screening should be stopped for people who develop a health problem that would prevent them from having lung cancer treatment.  If you are pregnant, do not drink alcohol. If you are  breastfeeding, be very cautious about drinking alcohol. If you are not pregnant and choose to drink alcohol, do not have more than 1 drink per day. One drink is considered to be 12 ounces (355 mL) of beer, 5 ounces (148 mL) of wine, or 1.5 ounces (44 mL) of liquor.  Avoid use of street drugs. Do not share needles with anyone. Ask for help if you need support or instructions about stopping the use of drugs.  High blood pressure causes heart disease and increases the risk  of stroke. Your blood pressure should be checked at least every 1 to 2 years. Ongoing high blood pressure should be treated with medicines if weight loss and exercise do not work.  If you are 55-79 years old, ask your health care provider if you should take aspirin to prevent strokes.  Diabetes screening is done by taking a blood sample to check your blood glucose level after you have not eaten for a certain period of time (fasting). If you are not overweight and you do not have risk factors for diabetes, you should be screened once every 3 years starting at age 45. If you are overweight or obese and you are 40-70 years of age, you should be screened for diabetes every year as part of your cardiovascular risk assessment.  Breast cancer screening is essential preventive care for women. You should practice "breast self-awareness." This means understanding the normal appearance and feel of your breasts and may include breast self-examination. Any changes detected, no matter how small, should be reported to a health care provider. Women in their 20s and 30s should have a clinical breast exam (CBE) by a health care provider as part of a regular health exam every 1 to 3 years. After age 40, women should have a CBE every year. Starting at age 40, women should consider having a mammogram (breast X-ray test) every year. Women who have a family history of breast cancer should talk to their health care provider about genetic screening. Women at a high risk of breast cancer should talk to their health care providers about having an MRI and a mammogram every year.  Breast cancer gene (BRCA)-related cancer risk assessment is recommended for women who have family members with BRCA-related cancers. BRCA-related cancers include breast, ovarian, tubal, and peritoneal cancers. Having family members with these cancers may be associated with an increased risk for harmful changes (mutations) in the breast cancer genes BRCA1 and  BRCA2. Results of the assessment will determine the need for genetic counseling and BRCA1 and BRCA2 testing.  Your health care provider may recommend that you be screened regularly for cancer of the pelvic organs (ovaries, uterus, and vagina). This screening involves a pelvic examination, including checking for microscopic changes to the surface of your cervix (Pap test). You may be encouraged to have this screening done every 3 years, beginning at age 21.  For women ages 30-65, health care providers may recommend pelvic exams and Pap testing every 3 years, or they may recommend the Pap and pelvic exam, combined with testing for human papilloma virus (HPV), every 5 years. Some types of HPV increase your risk of cervical cancer. Testing for HPV may also be done on women of any age with unclear Pap test results.  Other health care providers may not recommend any screening for nonpregnant women who are considered low risk for pelvic cancer and who do not have symptoms. Ask your health care provider if a screening pelvic exam is right for   you.  If you have had past treatment for cervical cancer or a condition that could lead to cancer, you need Pap tests and screening for cancer for at least 20 years after your treatment. If Pap tests have been discontinued, your risk factors (such as having a new sexual partner) need to be reassessed to determine if screening should resume. Some women have medical problems that increase the chance of getting cervical cancer. In these cases, your health care provider may recommend more frequent screening and Pap tests.  Colorectal cancer can be detected and often prevented. Most routine colorectal cancer screening begins at the age of 50 years and continues through age 75 years. However, your health care provider may recommend screening at an earlier age if you have risk factors for colon cancer. On a yearly basis, your health care provider may provide home test kits to check  for hidden blood in the stool. Use of a small camera at the end of a tube, to directly examine the colon (sigmoidoscopy or colonoscopy), can detect the earliest forms of colorectal cancer. Talk to your health care provider about this at age 50, when routine screening begins. Direct exam of the colon should be repeated every 5-10 years through age 75 years, unless early forms of precancerous polyps or small growths are found.  People who are at an increased risk for hepatitis B should be screened for this virus. You are considered at high risk for hepatitis B if:  You were born in a country where hepatitis B occurs often. Talk with your health care provider about which countries are considered high risk.  Your parents were born in a high-risk country and you have not received a shot to protect against hepatitis B (hepatitis B vaccine).  You have HIV or AIDS.  You use needles to inject street drugs.  You live with, or have sex with, someone who has hepatitis B.  You get hemodialysis treatment.  You take certain medicines for conditions like cancer, organ transplantation, and autoimmune conditions.  Hepatitis C blood testing is recommended for all people born from 1945 through 1965 and any individual with known risks for hepatitis C.  Practice safe sex. Use condoms and avoid high-risk sexual practices to reduce the spread of sexually transmitted infections (STIs). STIs include gonorrhea, chlamydia, syphilis, trichomonas, herpes, HPV, and human immunodeficiency virus (HIV). Herpes, HIV, and HPV are viral illnesses that have no cure. They can result in disability, cancer, and death.  You should be screened for sexually transmitted illnesses (STIs) including gonorrhea and chlamydia if:  You are sexually active and are younger than 24 years.  You are older than 24 years and your health care provider tells you that you are at risk for this type of infection.  Your sexual activity has changed  since you were last screened and you are at an increased risk for chlamydia or gonorrhea. Ask your health care provider if you are at risk.  If you are at risk of being infected with HIV, it is recommended that you take a prescription medicine daily to prevent HIV infection. This is called preexposure prophylaxis (PrEP). You are considered at risk if:  You are sexually active and do not regularly use condoms or know the HIV status of your partner(s).  You take drugs by injection.  You are sexually active with a partner who has HIV.  Talk with your health care provider about whether you are at high risk of being infected with HIV. If   you choose to begin PrEP, you should first be tested for HIV. You should then be tested every 3 months for as long as you are taking PrEP.  Osteoporosis is a disease in which the bones lose minerals and strength with aging. This can result in serious bone fractures or breaks. The risk of osteoporosis can be identified using a bone density scan. Women ages 67 years and over and women at risk for fractures or osteoporosis should discuss screening with their health care providers. Ask your health care provider whether you should take a calcium supplement or vitamin D to reduce the rate of osteoporosis.  Menopause can be associated with physical symptoms and risks. Hormone replacement therapy is available to decrease symptoms and risks. You should talk to your health care provider about whether hormone replacement therapy is right for you.  Use sunscreen. Apply sunscreen liberally and repeatedly throughout the day. You should seek shade when your shadow is shorter than you. Protect yourself by wearing long sleeves, pants, a wide-brimmed hat, and sunglasses year round, whenever you are outdoors.  Once a month, do a whole body skin exam, using a mirror to look at the skin on your back. Tell your health care provider of new moles, moles that have irregular borders, moles that  are larger than a pencil eraser, or moles that have changed in shape or color.  Stay current with required vaccines (immunizations).  Influenza vaccine. All adults should be immunized every year.  Tetanus, diphtheria, and acellular pertussis (Td, Tdap) vaccine. Pregnant women should receive 1 dose of Tdap vaccine during each pregnancy. The dose should be obtained regardless of the length of time since the last dose. Immunization is preferred during the 27th-36th week of gestation. An adult who has not previously received Tdap or who does not know her vaccine status should receive 1 dose of Tdap. This initial dose should be followed by tetanus and diphtheria toxoids (Td) booster doses every 10 years. Adults with an unknown or incomplete history of completing a 3-dose immunization series with Td-containing vaccines should begin or complete a primary immunization series including a Tdap dose. Adults should receive a Td booster every 10 years.  Varicella vaccine. An adult without evidence of immunity to varicella should receive 2 doses or a second dose if she has previously received 1 dose. Pregnant females who do not have evidence of immunity should receive the first dose after pregnancy. This first dose should be obtained before leaving the health care facility. The second dose should be obtained 4-8 weeks after the first dose.  Human papillomavirus (HPV) vaccine. Females aged 13-26 years who have not received the vaccine previously should obtain the 3-dose series. The vaccine is not recommended for use in pregnant females. However, pregnancy testing is not needed before receiving a dose. If a female is found to be pregnant after receiving a dose, no treatment is needed. In that case, the remaining doses should be delayed until after the pregnancy. Immunization is recommended for any person with an immunocompromised condition through the age of 61 years if she did not get any or all doses earlier. During the  3-dose series, the second dose should be obtained 4-8 weeks after the first dose. The third dose should be obtained 24 weeks after the first dose and 16 weeks after the second dose.  Zoster vaccine. One dose is recommended for adults aged 30 years or older unless certain conditions are present.  Measles, mumps, and rubella (MMR) vaccine. Adults born  before 1957 generally are considered immune to measles and mumps. Adults born in 1957 or later should have 1 or more doses of MMR vaccine unless there is a contraindication to the vaccine or there is laboratory evidence of immunity to each of the three diseases. A routine second dose of MMR vaccine should be obtained at least 28 days after the first dose for students attending postsecondary schools, health care workers, or international travelers. People who received inactivated measles vaccine or an unknown type of measles vaccine during 1963-1967 should receive 2 doses of MMR vaccine. People who received inactivated mumps vaccine or an unknown type of mumps vaccine before 1979 and are at high risk for mumps infection should consider immunization with 2 doses of MMR vaccine. For females of childbearing age, rubella immunity should be determined. If there is no evidence of immunity, females who are not pregnant should be vaccinated. If there is no evidence of immunity, females who are pregnant should delay immunization until after pregnancy. Unvaccinated health care workers born before 1957 who lack laboratory evidence of measles, mumps, or rubella immunity or laboratory confirmation of disease should consider measles and mumps immunization with 2 doses of MMR vaccine or rubella immunization with 1 dose of MMR vaccine.  Pneumococcal 13-valent conjugate (PCV13) vaccine. When indicated, a person who is uncertain of his immunization history and has no record of immunization should receive the PCV13 vaccine. All adults 65 years of age and older should receive this  vaccine. An adult aged 19 years or older who has certain medical conditions and has not been previously immunized should receive 1 dose of PCV13 vaccine. This PCV13 should be followed with a dose of pneumococcal polysaccharide (PPSV23) vaccine. Adults who are at high risk for pneumococcal disease should obtain the PPSV23 vaccine at least 8 weeks after the dose of PCV13 vaccine. Adults older than 62 years of age who have normal immune system function should obtain the PPSV23 vaccine dose at least 1 year after the dose of PCV13 vaccine.  Pneumococcal polysaccharide (PPSV23) vaccine. When PCV13 is also indicated, PCV13 should be obtained first. All adults aged 65 years and older should be immunized. An adult younger than age 65 years who has certain medical conditions should be immunized. Any person who resides in a nursing home or long-term care facility should be immunized. An adult smoker should be immunized. People with an immunocompromised condition and certain other conditions should receive both PCV13 and PPSV23 vaccines. People with human immunodeficiency virus (HIV) infection should be immunized as soon as possible after diagnosis. Immunization during chemotherapy or radiation therapy should be avoided. Routine use of PPSV23 vaccine is not recommended for American Indians, [place] Natives, or people younger than 65 years unless there are medical conditions that require PPSV23 vaccine. When indicated, people who have unknown immunization and have no record of immunization should receive PPSV23 vaccine. One-time revaccination 5 years after the first dose of PPSV23 is recommended for people aged 19-64 years who have chronic kidney failure, nephrotic syndrome, asplenia, or immunocompromised conditions. People who received 1-2 doses of PPSV23 before age 65 years should receive another dose of PPSV23 vaccine at age 65 years or later if at least 5 years have passed since the previous dose. Doses of PPSV23 are not  needed for people immunized with PPSV23 at or after age 65 years.  Meningococcal vaccine. Adults with asplenia or persistent complement component deficiencies should receive 2 doses of quadrivalent meningococcal conjugate (MenACWY-D) vaccine. The doses should be obtained   at least 2 months apart. Microbiologists working with certain meningococcal bacteria, [place] recruits, people at risk during an outbreak, and people who travel to or live in countries with a high rate of meningitis should be immunized. A first-year college student up through age 34 years who is living in a residence hall should receive a dose if she did not receive a dose on or after her 16th birthday. Adults who have certain high-risk conditions should receive one or more doses of vaccine.  Hepatitis A vaccine. Adults who wish to be protected from this disease, have certain high-risk conditions, work with hepatitis A-infected animals, work in hepatitis A research labs, or travel to or work in countries with a high rate of hepatitis A should be immunized. Adults who were previously unvaccinated and who anticipate close contact with an international adoptee during the first 60 days after arrival in the [place] from a country with a high rate of hepatitis A should be immunized.  Hepatitis B vaccine. Adults who wish to be protected from this disease, have certain high-risk conditions, may be exposed to blood or other infectious body fluids, are household contacts or sex partners of hepatitis B positive people, are clients or workers in certain care facilities, or travel to or work in countries with a high rate of hepatitis B should be immunized.  Haemophilus influenzae type b (Hib) vaccine. A previously unvaccinated person with asplenia or sickle cell disease or having a scheduled splenectomy should receive 1 dose of Hib vaccine. Regardless of previous immunization, a recipient of a hematopoietic stem cell transplant should receive a  3-dose series 6-12 months after her successful transplant. Hib vaccine is not recommended for adults with HIV infection. Preventive Services / Frequency Ages 35 to 4 years  Blood pressure check.** / Every 3-5 years.  Lipid and cholesterol check.** / Every 5 years beginning at age 60.  Clinical breast exam.** / Every 3 years for women in their 71s and 10s.  BRCA-related cancer risk assessment.** / For women who have family members with a BRCA-related cancer (breast, ovarian, tubal, or peritoneal cancers).  Pap test.** / Every 2 years from ages 76 through 26. Every 3 years starting at age 61 through age 76 or 93 with a history of 3 consecutive normal Pap tests.  HPV screening.** / Every 3 years from ages 37 through ages 60 to 51 with a history of 3 consecutive normal Pap tests.  Hepatitis C blood test.** / For any individual with known risks for hepatitis C.  Skin self-exam. / Monthly.  Influenza vaccine. / Every year.  Tetanus, diphtheria, and acellular pertussis (Tdap, Td) vaccine.** / Consult your health care provider. Pregnant women should receive 1 dose of Tdap vaccine during each pregnancy. 1 dose of Td every 10 years.  Varicella vaccine.** / Consult your health care provider. Pregnant females who do not have evidence of immunity should receive the first dose after pregnancy.  HPV vaccine. / 3 doses over 6 months, if 93 and younger. The vaccine is not recommended for use in pregnant females. However, pregnancy testing is not needed before receiving a dose.  Measles, mumps, rubella (MMR) vaccine.** / You need at least 1 dose of MMR if you were born in 1957 or later. You may also need a 2nd dose. For females of childbearing age, rubella immunity should be determined. If there is no evidence of immunity, females who are not pregnant should be vaccinated. If there is no evidence of immunity, females who are  pregnant should delay immunization until after pregnancy.  Pneumococcal  13-valent conjugate (PCV13) vaccine.** / Consult your health care provider.  Pneumococcal polysaccharide (PPSV23) vaccine.** / 1 to 2 doses if you smoke cigarettes or if you have certain conditions.  Meningococcal vaccine.** / 1 dose if you are age 68 to 8 years and a Market researcher living in a residence hall, or have one of several medical conditions, you need to get vaccinated against meningococcal disease. You may also need additional booster doses.  Hepatitis A vaccine.** / Consult your health care provider.  Hepatitis B vaccine.** / Consult your health care provider.  Haemophilus influenzae type b (Hib) vaccine.** / Consult your health care provider. Ages 7 to 53 years  Blood pressure check.** / Every year.  Lipid and cholesterol check.** / Every 5 years beginning at age 25 years.  Lung cancer screening. / Every year if you are aged 11-80 years and have a 30-pack-year history of smoking and currently smoke or have quit within the past 15 years. Yearly screening is stopped once you have quit smoking for at least 15 years or develop a health problem that would prevent you from having lung cancer treatment.  Clinical breast exam.** / Every year after age 48 years.  BRCA-related cancer risk assessment.** / For women who have family members with a BRCA-related cancer (breast, ovarian, tubal, or peritoneal cancers).  Mammogram.** / Every year beginning at age 41 years and continuing for as long as you are in good health. Consult with your health care provider.  Pap test.** / Every 3 years starting at age 65 years through age 37 or 70 years with a history of 3 consecutive normal Pap tests.  HPV screening.** / Every 3 years from ages 72 years through ages 60 to 40 years with a history of 3 consecutive normal Pap tests.  Fecal occult blood test (FOBT) of stool. / Every year beginning at age 21 years and continuing until age 5 years. You may not need to do this test if you get  a colonoscopy every 10 years.  Flexible sigmoidoscopy or colonoscopy.** / Every 5 years for a flexible sigmoidoscopy or every 10 years for a colonoscopy beginning at age 35 years and continuing until age 48 years.  Hepatitis C blood test.** / For all people born from [birth year] through 1965 and any individual with known risks for hepatitis C.  Skin self-exam. / Monthly.  Influenza vaccine. / Every year.  Tetanus, diphtheria, and acellular pertussis (Tdap/Td) vaccine.** / Consult your health care provider. Pregnant women should receive 1 dose of Tdap vaccine during each pregnancy. 1 dose of Td every 10 years.  Varicella vaccine.** / Consult your health care provider. Pregnant females who do not have evidence of immunity should receive the first dose after pregnancy.  Zoster vaccine.** / 1 dose for adults aged 30 years or older.  Measles, mumps, rubella (MMR) vaccine.** / You need at least 1 dose of MMR if you were born in 1957 or later. You may also need a second dose. For females of childbearing age, rubella immunity should be determined. If there is no evidence of immunity, females who are not pregnant should be vaccinated. If there is no evidence of immunity, females who are pregnant should delay immunization until after pregnancy.  Pneumococcal 13-valent conjugate (PCV13) vaccine.** / Consult your health care provider.  Pneumococcal polysaccharide (PPSV23) vaccine.** / 1 to 2 doses if you smoke cigarettes or if you have certain conditions.  Meningococcal vaccine.** /  Consult your health care provider.  Hepatitis A vaccine.** / Consult your health care provider.  Hepatitis B vaccine.** / Consult your health care provider.  Haemophilus influenzae type b (Hib) vaccine.** / Consult your health care provider. Ages 64 years and over  Blood pressure check.** / Every year.  Lipid and cholesterol check.** / Every 5 years beginning at age 23 years.  Lung cancer screening. / Every year if you  are aged 16-80 years and have a 30-pack-year history of smoking and currently smoke or have quit within the past 15 years. Yearly screening is stopped once you have quit smoking for at least 15 years or develop a health problem that would prevent you from having lung cancer treatment.  Clinical breast exam.** / Every year after age 74 years.  BRCA-related cancer risk assessment.** / For women who have family members with a BRCA-related cancer (breast, ovarian, tubal, or peritoneal cancers).  Mammogram.** / Every year beginning at age 44 years and continuing for as long as you are in good health. Consult with your health care provider.  Pap test.** / Every 3 years starting at age 58 years through age 22 or 39 years with 3 consecutive normal Pap tests. Testing can be stopped between 65 and 70 years with 3 consecutive normal Pap tests and no abnormal Pap or HPV tests in the past 10 years.  HPV screening.** / Every 3 years from ages 64 years through ages 70 or 61 years with a history of 3 consecutive normal Pap tests. Testing can be stopped between 65 and 70 years with 3 consecutive normal Pap tests and no abnormal Pap or HPV tests in the past 10 years.  Fecal occult blood test (FOBT) of stool. / Every year beginning at age 40 years and continuing until age 27 years. You may not need to do this test if you get a colonoscopy every 10 years.  Flexible sigmoidoscopy or colonoscopy.** / Every 5 years for a flexible sigmoidoscopy or every 10 years for a colonoscopy beginning at age 7 years and continuing until age 32 years.  Hepatitis C blood test.** / For all people born from [birth year] through 1965 and any individual with known risks for hepatitis C.  Osteoporosis screening.** / A one-time screening for women ages 30 years and over and women at risk for fractures or osteoporosis.  Skin self-exam. / Monthly.  Influenza vaccine. / Every year.  Tetanus, diphtheria, and acellular pertussis (Tdap/Td)  vaccine.** / 1 dose of Td every 10 years.  Varicella vaccine.** / Consult your health care provider.  Zoster vaccine.** / 1 dose for adults aged 35 years or older.  Pneumococcal 13-valent conjugate (PCV13) vaccine.** / Consult your health care provider.  Pneumococcal polysaccharide (PPSV23) vaccine.** / 1 dose for all adults aged 46 years and older.  Meningococcal vaccine.** / Consult your health care provider.  Hepatitis A vaccine.** / Consult your health care provider.  Hepatitis B vaccine.** / Consult your health care provider.  Haemophilus influenzae type b (Hib) vaccine.** / Consult your health care provider. ** Family history and personal history of risk and conditions may change your health care provider's recommendations.   This information is not intended to replace advice given to you by your health care provider. Make sure you discuss any questions you have with your health care provider.   Document Released: 05/26/2001 Document Revised: 04/20/2014 Document Reviewed: 08/25/2010 Elsevier Interactive Patient Education [employer].

## 2015-02-05 NOTE — Progress Notes (Signed)
Pre visit review using our clinic review tool, if applicable. No additional management support is needed unless otherwise documented below in the visit note. 
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
## 2015-02-14 ENCOUNTER — Encounter: Payer: Self-pay | Admitting: Cardiology

## 2015-04-16 ENCOUNTER — Telehealth: Payer: Self-pay | Admitting: Family Medicine

## 2015-04-16 MED ORDER — VALACYCLOVIR HCL 1 G PO TABS: 1000.0000 mg | ORAL_TABLET | Freq: Three times a day (TID) | ORAL | Status: DC

## 2015-04-16 NOTE — Telephone Encounter (Signed)
CPE 02/05/15. Please advise    KP

## 2015-04-16 NOTE — Telephone Encounter (Signed)
Caller name:Hyacinth [name] Relationship to patient:self Can be reached:[phone] Pharmacy:[facility] [address]  Reason for call: Requesting something be called in for her fever blister. Please advise.

## 2015-04-16 NOTE — Telephone Encounter (Signed)
I've tried to call the patient but the mailbox was full. Unable to leave a VM. Rx faxed.     KP

## 2015-04-16 NOTE — Telephone Encounter (Signed)
Valtrex 100 mg tid x 7 days--- she never told us she had them.  If she gets them frequently this needs to be on her med list as prn

## 2015-04-17 ENCOUNTER — Telehealth: Payer: Self-pay | Admitting: Family Medicine

## 2015-04-17 NOTE — Telephone Encounter (Signed)
Relation to [id number] Call back [place]:  [id number] - [place], [address] [phone] (Phone) [phone] (Fax)         Reason for call:  Patient inquiring about topical cream for fever blister

## 2015-04-19 NOTE — Telephone Encounter (Signed)
Valtrex was sent on 04/16/15, VM left advising patient RX faxed to [facility]

## 2015-06-03 ENCOUNTER — Encounter: Payer: Self-pay | Admitting: Cardiology

## 2015-06-10 ENCOUNTER — Telehealth: Payer: Self-pay | Admitting: Cardiology

## 2015-06-10 NOTE — Telephone Encounter (Signed)
**Note De-Identified Christine Walker Obfuscation** The pt c/o CP/tightness in the center of her chest off and on X 4 days. She denies SOB, nausea, radiation of pain, dizziness or diaphoresis. She states that the last time she saw Dr turner in 08/2013 she was advised that her chest pain was gerd. She want to know what she should do. She is advised that I will speak with Dr Radford Pax, who is our DOD today, and call her back with her recommendations.

## 2015-06-10 NOTE — Telephone Encounter (Signed)
**Note De-Identified Christine Walker Obfuscation** Per Dr Radford Pax the pt is advised to see her PCP tomorrow. She verbalized understanding and is in agreement with plan.

## 2015-06-10 NOTE — Telephone Encounter (Signed)
New message      Pt c/o of Chest Pain: STAT if CP now or developed within 24 hours  1. Are you having CP right now?  yes  2. Are you experiencing any other symptoms (ex. SOB, nausea, vomiting, sweating)? no 3. How long have you been experiencing CP?  4 days 4. Is your CP continuous or coming and going?  Comes and goes 5. Have you taken Nitroglycerin? no ?

## 2015-06-24 ENCOUNTER — Ambulatory Visit (INDEPENDENT_AMBULATORY_CARE_PROVIDER_SITE_OTHER): Payer: BC Managed Care – PPO | Admitting: Family Medicine

## 2015-06-24 ENCOUNTER — Encounter: Payer: Self-pay | Admitting: Family Medicine

## 2015-06-24 VITALS — BP 136/62 | HR 63 | Temp 98.2°F | Wt 157.4 lb

## 2015-06-24 DIAGNOSIS — E785 Hyperlipidemia, unspecified: Secondary | ICD-10-CM

## 2015-06-24 DIAGNOSIS — R1013 Epigastric pain: Secondary | ICD-10-CM | POA: Diagnosis not present

## 2015-06-24 DIAGNOSIS — R079 Chest pain, unspecified: Secondary | ICD-10-CM

## 2015-06-24 MED ORDER — RANITIDINE HCL 150 MG PO TABS: 150.0000 mg | ORAL_TABLET | Freq: Two times a day (BID) | ORAL | Status: DC

## 2015-06-24 MED ORDER — GI COCKTAIL ~~LOC~~
30.0000 mL | Freq: Once | ORAL | Status: AC
  Administered 2015-06-24: 30 mL via ORAL

## 2015-06-24 NOTE — Patient Instructions (Signed)
Food Choices for Gastroesophageal Reflux Disease, Adult When you have gastroesophageal reflux disease (GERD), the foods you eat and your eating habits are very important. Choosing the right foods can help ease the discomfort of GERD. WHAT GENERAL GUIDELINES DO I NEED TO FOLLOW?  Choose fruits, vegetables, whole grains, low-fat dairy products, and low-fat meat, fish, and poultry.  Limit fats such as oils, salad dressings, butter, nuts, and avocado.  Keep a food diary to identify foods that cause symptoms.  Avoid foods that cause reflux. These may be different for different people.  Eat frequent small meals instead of three large meals each day.  Eat your meals slowly, in a relaxed setting.  Limit fried foods.  Cook foods using methods other than frying.  Avoid drinking alcohol.  Avoid drinking large amounts of liquids with your meals.  Avoid bending over or lying down until 2-3 hours after eating. WHAT FOODS ARE NOT RECOMMENDED? The following are some foods and drinks that may worsen your symptoms: Vegetables Tomatoes. Tomato juice. Tomato and spaghetti sauce. Chili peppers. Onion and garlic. Horseradish. Fruits Oranges, grapefruit, and lemon (fruit and juice). Meats High-fat meats, fish, and poultry. This includes hot dogs, ribs, ham, sausage, salami, and bacon. Dairy Whole milk and chocolate milk. Sour cream. Cream. Butter. Ice cream. Cream cheese.  Beverages Coffee and tea, with or without caffeine. Carbonated beverages or energy drinks. Condiments Hot sauce. Barbecue sauce.  Sweets/Desserts Chocolate and cocoa. Donuts. Peppermint and spearmint. Fats and Oils High-fat foods, including French fries and potato chips. Other Vinegar. Strong spices, such as black pepper, white pepper, red pepper, cayenne, curry powder, cloves, ginger, and chili powder. The items listed above may not be a complete list of foods and beverages to avoid. Contact your dietitian for more  information.   This information is not intended to replace advice given to you by your health care provider. Make sure you discuss any questions you have with your health care provider.   Document Released: 03/30/2005 Document Revised: 04/20/2014 Document Reviewed: 02/01/2013 Elsevier Interactive Patient Education 2016 Elsevier Inc[employer]  

## 2015-06-24 NOTE — Progress Notes (Signed)
Pre visit review using our clinic review tool, if applicable. No additional management support is needed unless otherwise documented below in the visit note. 
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
## 2015-06-25 LAB — COMPREHENSIVE METABOLIC PANEL
ALBUMIN: 4.1 g/dL (ref 3.5–5.2)
ALK PHOS: 47 U/L (ref 39–117)
ALT: 11 U/L (ref 0–35)
AST: 17 U/L (ref 0–37)
BILIRUBIN TOTAL: 0.4 mg/dL (ref 0.2–1.2)
BUN: 14 mg/dL (ref 6–23)
CO2: 31 mEq/L (ref 19–32)
CREATININE: 0.83 mg/dL (ref 0.40–1.20)
Calcium: 9.7 mg/dL (ref 8.4–10.5)
Chloride: 102 mEq/L (ref 96–112)
GFR: 89.36 mL/min (ref >60.00)
Glucose, Bld: 108 mg/dL — ABNORMAL HIGH (ref 70–99)
POTASSIUM: 4.2 meq/L (ref 3.5–5.1)
SODIUM: 140 meq/L (ref 135–145)
Total Protein: 6.7 g/dL (ref 6.0–8.3)

## 2015-06-25 LAB — LDL CHOLESTEROL, DIRECT: Direct LDL: 128 mg/dL

## 2015-06-25 LAB — CBC WITH DIFFERENTIAL/PLATELET
BASOS ABS: 0 10*3/uL (ref 0.0–0.1)
Basophils Relative: 0.7 % (ref 0.0–3.0)
EOS ABS: 0.2 10*3/uL (ref 0.0–0.7)
EOS PCT: 4.6 % (ref 0.0–5.0)
HCT: 38.6 % (ref 36.0–46.0)
HEMOGLOBIN: 13.2 g/dL (ref 12.0–15.0)
LYMPHS ABS: 1.4 10*3/uL (ref 0.7–4.0)
Lymphocytes Relative: 41.9 % (ref 12.0–46.0)
MCHC: 34.1 g/dL (ref 30.0–36.0)
MCV: 91.3 fl (ref 78.0–100.0)
MONO ABS: 0.2 10*3/uL (ref 0.1–1.0)
Monocytes Relative: 6.5 % (ref 3.0–12.0)
NEUTROS PCT: 46.3 % (ref 43.0–77.0)
Neutro Abs: 1.5 10*3/uL (ref 1.4–7.7)
Platelets: 286 10*3/uL (ref 150.0–400.0)
RBC: 4.23 Mil/uL (ref 3.87–5.11)
RDW: 12.9 % (ref 11.5–15.5)
WBC: 3.3 10*3/uL — AB (ref 4.0–10.5)

## 2015-06-25 LAB — LIPID PANEL
CHOLESTEROL: 207 mg/dL — AB (ref 0–200)
HDL: 57.2 mg/dL (ref >39.00)
NonHDL: 150.25
Total CHOL/HDL Ratio: 4
Triglycerides: 202 mg/dL — ABNORMAL HIGH (ref 0.0–149.0)
VLDL: 40.4 mg/dL — ABNORMAL HIGH (ref 0.0–40.0)

## 2015-06-25 LAB — H. PYLORI ANTIBODY, IGG: H PYLORI IGG: NEGATIVE

## 2015-07-19 ENCOUNTER — Ambulatory Visit (INDEPENDENT_AMBULATORY_CARE_PROVIDER_SITE_OTHER): Payer: BC Managed Care – PPO | Admitting: Cardiology

## 2015-07-19 ENCOUNTER — Encounter: Payer: Self-pay | Admitting: Cardiology

## 2015-07-19 VITALS — BP 138/76 | HR 74 | Ht 66.0 in | Wt 154.0 lb

## 2015-07-19 DIAGNOSIS — I10 Essential (primary) hypertension: Secondary | ICD-10-CM

## 2015-07-19 DIAGNOSIS — I4510 Unspecified right bundle-branch block: Secondary | ICD-10-CM | POA: Diagnosis not present

## 2015-07-19 DIAGNOSIS — R079 Chest pain, unspecified: Secondary | ICD-10-CM | POA: Diagnosis not present

## 2015-07-19 DIAGNOSIS — I330 Acute and subacute infective endocarditis: Secondary | ICD-10-CM

## 2015-07-19 DIAGNOSIS — R0789 Other chest pain: Secondary | ICD-10-CM | POA: Diagnosis not present

## 2015-07-19 LAB — BASIC METABOLIC PANEL
BUN: 12 mg/dL (ref 7–25)
CALCIUM: 9.2 mg/dL (ref 8.6–10.4)
CO2: 29 mmol/L (ref 20–31)
Chloride: 103 mmol/L (ref 98–110)
Creat: 1.05 mg/dL — ABNORMAL HIGH (ref 0.50–0.99)
Glucose, Bld: 110 mg/dL — ABNORMAL HIGH (ref 65–99)
Potassium: 3.6 mmol/L (ref 3.5–5.3)
SODIUM: 145 mmol/L (ref 135–146)
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
## 2015-07-19 NOTE — Patient Instructions (Signed)
Medication Instructions:  None  Labwork: BMET today  Testing/Procedures: Your physician has requested that you have an echocardiogram. Echocardiography is a painless test that uses sound waves to create images of your heart. It provides your doctor with information about the size and shape of your heart and how well your heart's chambers and valves are working. This procedure takes approximately one hour. There are no restrictions for this procedure.  Non-Cardiac CT Angiography (CTA), is a special type of CT scan that uses a computer to produce multi-dimensional views of major blood vessels throughout the body. In CT angiography, a contrast material is injected through an IV to help visualize the blood vessels   Follow-Up: Your physician recommends that you schedule a follow-up appointment as needed with Dr. Radford Pax.   Any Other Special Instructions Will Be Listed Below (If Applicable).     If you need a refill on your cardiac medications before your next appointment, please call your pharmacy.

## 2015-07-20 ENCOUNTER — Other Ambulatory Visit: Payer: Self-pay | Admitting: Family Medicine

## 2015-07-22 ENCOUNTER — Encounter: Payer: Self-pay | Admitting: Family Medicine

## 2015-07-22 ENCOUNTER — Ambulatory Visit (INDEPENDENT_AMBULATORY_CARE_PROVIDER_SITE_OTHER): Payer: BC Managed Care – PPO | Admitting: Family Medicine

## 2015-07-22 VITALS — BP 116/74 | HR 70 | Temp 98.2°F | Wt 153.8 lb

## 2015-07-22 DIAGNOSIS — J0110 Acute frontal sinusitis, unspecified: Secondary | ICD-10-CM | POA: Diagnosis not present

## 2015-07-22 DIAGNOSIS — J209 Acute bronchitis, unspecified: Secondary | ICD-10-CM

## 2015-07-22 MED ORDER — CLARITHROMYCIN ER 500 MG PO TB24: 1000.0000 mg | ORAL_TABLET | Freq: Every day | ORAL | Status: AC

## 2015-07-22 MED ORDER — HYDROCOD POLST-CPM POLST ER 10-8 MG/5ML PO SUER: 5.0000 mL | Freq: Two times a day (BID) | ORAL | Status: DC | PRN

## 2015-07-22 NOTE — Progress Notes (Signed)
Pre visit review using our clinic review tool, if applicable. No additional management support is needed unless otherwise documented below in the visit note. 
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
## 2015-07-22 NOTE — Patient Instructions (Signed)

## 2015-07-23 ENCOUNTER — Telehealth: Payer: Self-pay

## 2015-07-23 DIAGNOSIS — I10 Essential (primary) hypertension: Secondary | ICD-10-CM

## 2015-07-23 MED ORDER — POTASSIUM CHLORIDE CRYS ER 20 MEQ PO TBCR: 20.0000 meq | EXTENDED_RELEASE_TABLET | Freq: Every day | ORAL | Status: DC

## 2015-07-23 NOTE — Telephone Encounter (Signed)
Informed patient of results and verbal understanding expressed.  Instructed patient to START KDUR 20 meq daily. BMET scheduled 07/31/15. Patient agrees with treatment plan.

## 2015-07-23 NOTE — Telephone Encounter (Signed)
-----   Message from Sueanne Margarita, MD sent at 07/20/2015  2:24 PM EDT ----- Potassium on low end of normal.  Add Kdur 49meq daily and repeat BMET in 1 week

## 2015-07-31 ENCOUNTER — Other Ambulatory Visit (INDEPENDENT_AMBULATORY_CARE_PROVIDER_SITE_OTHER): Payer: BC Managed Care – PPO

## 2015-07-31 DIAGNOSIS — I10 Essential (primary) hypertension: Secondary | ICD-10-CM

## 2015-07-31 LAB — BASIC METABOLIC PANEL
BUN: 17 mg/dL (ref 7–25)
CHLORIDE: 101 mmol/L (ref 98–110)
CO2: 28 mmol/L (ref 20–31)
Calcium: 9.3 mg/dL (ref 8.6–10.4)
Creat: 0.9 mg/dL (ref 0.50–0.99)
GLUCOSE: 137 mg/dL — AB (ref 65–99)
Potassium: 4.1 mmol/L (ref 3.5–5.3)
Sodium: 138 mmol/L (ref 135–146)

## 2015-08-02 ENCOUNTER — Telehealth: Payer: Self-pay

## 2015-08-02 DIAGNOSIS — R739 Hyperglycemia, unspecified: Secondary | ICD-10-CM

## 2015-08-02 NOTE — Telephone Encounter (Signed)
Notes Recorded by Theodoro Parma, RN on 08/01/2015 at 9:37 AM Left message to call back.  Forwarded to PCP. Notes Recorded by Sueanne Margarita, MD on 07/31/2015 at 9:40 PM Please let patient know that labs were normal except elevated BS. Continue current medical therapy and forward to PCP

## 2015-08-07 ENCOUNTER — Ambulatory Visit (HOSPITAL_COMMUNITY): Payer: BC Managed Care – PPO

## 2015-08-07 ENCOUNTER — Encounter: Payer: Self-pay | Admitting: Cardiology

## 2015-08-08 ENCOUNTER — Other Ambulatory Visit: Payer: Self-pay

## 2015-08-08 ENCOUNTER — Ambulatory Visit (HOSPITAL_COMMUNITY): Payer: BC Managed Care – PPO | Attending: Cardiology

## 2015-08-08 DIAGNOSIS — R079 Chest pain, unspecified: Secondary | ICD-10-CM | POA: Diagnosis not present

## 2015-08-08 DIAGNOSIS — I341 Nonrheumatic mitral (valve) prolapse: Secondary | ICD-10-CM | POA: Insufficient documentation

## 2015-08-08 DIAGNOSIS — I340 Nonrheumatic mitral (valve) insufficiency: Secondary | ICD-10-CM | POA: Diagnosis not present

## 2015-08-08 DIAGNOSIS — I10 Essential (primary) hypertension: Secondary | ICD-10-CM | POA: Diagnosis not present

## 2015-08-08 DIAGNOSIS — K219 Gastro-esophageal reflux disease without esophagitis: Secondary | ICD-10-CM | POA: Diagnosis not present

## 2015-08-08 DIAGNOSIS — E785 Hyperlipidemia, unspecified: Secondary | ICD-10-CM | POA: Diagnosis not present

## 2015-08-08 DIAGNOSIS — I351 Nonrheumatic aortic (valve) insufficiency: Secondary | ICD-10-CM | POA: Insufficient documentation

## 2015-08-08 DIAGNOSIS — I4510 Unspecified right bundle-branch block: Secondary | ICD-10-CM | POA: Insufficient documentation

## 2015-08-09 ENCOUNTER — Telehealth: Payer: Self-pay | Admitting: Cardiology

## 2015-08-09 NOTE — Telephone Encounter (Signed)
-----   Message from Sueanne Margarita, MD sent at 08/08/2015  4:09 PM EDT ----- Echo showed normal LVF with grade 1 diastolic dysfunction, mild MVP and mild MR

## 2015-08-09 NOTE — Telephone Encounter (Signed)
Informed patient of results and verbal understanding expressed.  

## 2015-08-09 NOTE — Telephone Encounter (Signed)
Follow Up ° ° ° °Returning call from earlier. Please call. °

## 2015-08-15 ENCOUNTER — Other Ambulatory Visit: Payer: Self-pay | Admitting: Family Medicine

## 2015-08-15 ENCOUNTER — Other Ambulatory Visit: Payer: Self-pay

## 2015-08-15 ENCOUNTER — Ambulatory Visit (HOSPITAL_COMMUNITY)
Admission: RE | Admit: 2015-08-15 | Discharge: 2015-08-15 | Disposition: A | Discharge status: Home / Self Care | Payer: BC Managed Care – PPO | Source: Ambulatory Visit | Attending: Cardiology | Admitting: Cardiology

## 2015-08-15 DIAGNOSIS — R079 Chest pain, unspecified: Secondary | ICD-10-CM

## 2015-08-15 MED ORDER — IOPAMIDOL (ISOVUE-370) INJECTION 76%
INTRAVENOUS | Status: AC
  Filled 2015-08-15: qty 100

## 2015-08-16 ENCOUNTER — Other Ambulatory Visit: Payer: Self-pay | Admitting: Cardiology

## 2015-08-16 DIAGNOSIS — R079 Chest pain, unspecified: Secondary | ICD-10-CM

## 2015-08-16 NOTE — Telephone Encounter (Signed)
I have tried to contact the patient again but her VM was full. She will need a CMP and A1c. The orders are in.     [place]

## 2015-08-16 NOTE — Telephone Encounter (Signed)
Message left to call the office.    KP 

## 2015-08-21 ENCOUNTER — Other Ambulatory Visit: Payer: Self-pay | Admitting: General Surgery

## 2015-08-22 ENCOUNTER — Ambulatory Visit (HOSPITAL_COMMUNITY)
Admission: RE | Admit: 2015-08-22 | Discharge: 2015-08-22 | Disposition: A | Discharge status: Home / Self Care | Payer: BC Managed Care – PPO | Source: Ambulatory Visit | Attending: Cardiology | Admitting: Cardiology

## 2015-08-22 ENCOUNTER — Other Ambulatory Visit: Payer: Self-pay | Admitting: Cardiology

## 2015-08-22 DIAGNOSIS — M47894 Other spondylosis, thoracic region: Secondary | ICD-10-CM | POA: Insufficient documentation

## 2015-08-22 DIAGNOSIS — R079 Chest pain, unspecified: Secondary | ICD-10-CM

## 2015-08-22 DIAGNOSIS — I77810 Thoracic aortic ectasia: Secondary | ICD-10-CM | POA: Insufficient documentation

## 2015-08-22 IMAGING — CT CT HEART MORP W/ CTA COR W/ SCORE W/ CA W/CM &/OR W/O CM
1 of 8 series · 1 of 20 positions shown, 2 images · IV contrast (Iodine)
Comparison: 02/01/2012

CLINICAL DATA: Chest pain

EXAM:
Cardiac CTA
MEDICATIONS:
Sub lingual nitro. 4mg and lopressor 5mg
TECHNIQUE: The patient was scanned on a [employer] [REDACTED]ice scanner. Gantry
rotation speed was 270 msecs. Collimation was .9mm. A 100 kV
prospective scan was triggered in the descending thoracic aorta at
111 HU's with 5% padding centered around 78% of the R-R interval.
Average HR during the scan was 72 bpm. The 3D data set was
interpreted on a dedicated work station using MPR, MIP and VRT
modes. A total of 80cc of contrast was used.

[Series 300: locator · axial · 0.35mm/px · z∈[-160,-160]mm · 1 of 1 slices shown, 2 images]
[im 1/1  vessel]
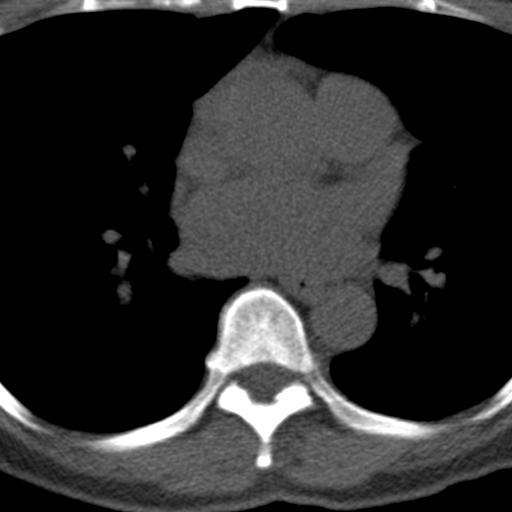
[im 1/1  lung]
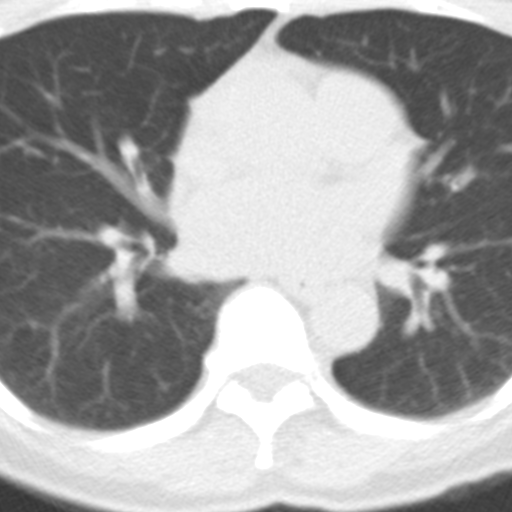

[1 of 20 positions shown; findings below may reference images not displayed]

FINDINGS: Non-cardiac: See separate report from [REDACTED]. No
significant findings on limited lung and soft tissue windows.

Calcium Score: 218 with significant calcification of the
proximal/mid RCA and proximal and mid LAD

Coronary Arteries: Right dominant with no anomalies

LM: Normal

LAD: Significant misregistration artifact in all 3 phases. Calcified
lesion in mid vessel with shadowing cannot r/o greater than 70%
blockage

IM: Normal

D2: Normal

Circumflex: Normal

OM1: Normal

OM2: Normal

RCA: Dominant significant motion artifact in 2 phases Calcified
lesion in proximal and mid vessel cannot r/o greater than 70%
stenosis

PDA: Normal

PLA:  Normal
IMPRESSION: 1) Calcium score 218 this was 92nd percentile for age and sex
matched controls

2) Poor quality study with misregistration artifact and motion
artifact in RCA. Calcified lesions in mid LAD and proximal to mid
RCA

Cannot r/o hemodynamically significant stenosis. Consider diagnostic
heart [place]

Marquis Goff

EXAM:
OVER-READ INTERPRETATION  CT CHEST

The following report is an over-read performed by radiologist Dr.
over-read does not include interpretation of cardiac or coronary
anatomy or pathology. The coronary CTA interpretation by the
cardiologist is attached.
FINDINGS: Mediastinum/Nodes: No adenopathy. Slightly ectatic ascending
thoracic aorta at 3.4 cm.

Lungs/Pleura: Unremarkable

Upper abdomen: Unremarkable

Musculoskeletal: Minimal thoracic spondylosis.
IMPRESSION: 1. Minimal thoracic spondylosis. Mildly ectatic ascending thoracic
aorta without aneurysm.

## 2015-08-22 MED ORDER — METOPROLOL TARTRATE 5 MG/5ML IV SOLN
INTRAVENOUS | Status: AC
  Administered 2015-08-22: 5 mg
  Filled 2015-08-22: qty 5

## 2015-08-22 MED ORDER — IOPAMIDOL (ISOVUE-370) INJECTION 76%
INTRAVENOUS | Status: AC
  Filled 2015-08-22: qty 100

## 2015-08-22 MED ORDER — LIDOCAINE HCL 1 % IJ SOLN
INTRAMUSCULAR | Status: AC
  Filled 2015-08-22: qty 20

## 2015-08-22 MED ORDER — NITROGLYCERIN 0.4 MG SL SUBL
SUBLINGUAL_TABLET | SUBLINGUAL | Status: AC
  Filled 2015-08-22: qty 1

## 2015-08-22 MED ORDER — NITROGLYCERIN 0.4 MG SL SUBL
0.4000 mg | SUBLINGUAL_TABLET | SUBLINGUAL | Status: DC | PRN
  Administered 2015-08-22: 0.8 mg via SUBLINGUAL

## 2015-08-22 NOTE — Procedures (Signed)
Successful placement of a 4 fr micropuncture sheath for temporary venous access via the right brachial vein.  The IV is ready for immediate use. EBL: None Complications: None Immediate.  Ronny Bacon, MD Pager #: [phone]

## 2015-08-27 ENCOUNTER — Telehealth: Payer: Self-pay | Admitting: Cardiology

## 2015-08-27 DIAGNOSIS — I25110 Atherosclerotic heart disease of native coronary artery with unstable angina pectoris: Secondary | ICD-10-CM

## 2015-08-27 DIAGNOSIS — E785 Hyperlipidemia, unspecified: Secondary | ICD-10-CM

## 2015-08-27 NOTE — Telephone Encounter (Signed)
Pt returned call to [place]-pls call [phone]

## 2015-08-27 NOTE — Telephone Encounter (Signed)
-----   Message from Sueanne Margarita, MD sent at 08/24/2015 11:04 PM EDT ----- Coronary CTA with significant calcification of the coronary arteries at 92%ile, at least 70% mid LAD and at least 70% prox to mid RCA - recommed cardiac cath for further evaluation.  Needs aggressive risk factor modification.  Check FLP and ALT and HBA1C

## 2015-08-27 NOTE — Telephone Encounter (Signed)
Informed patient of results and verbal understanding expressed.  Patient understands she will be called back to schedule OV and lab work as appointment is not available at this time.

## 2015-08-27 NOTE — Telephone Encounter (Signed)
Scheduled patient tomorrow, 5/16 with Truitt Merle to discuss catheterization. Since OV is at 1500, patient will be called to schedule fasting lab appointment after cath is scheduled. Patient was grateful for call.

## 2015-08-28 ENCOUNTER — Encounter: Payer: Self-pay | Admitting: Nurse Practitioner

## 2015-08-28 ENCOUNTER — Ambulatory Visit (INDEPENDENT_AMBULATORY_CARE_PROVIDER_SITE_OTHER): Payer: BC Managed Care – PPO | Admitting: Nurse Practitioner

## 2015-08-28 ENCOUNTER — Ambulatory Visit
Admission: RE | Admit: 2015-08-28 | Discharge: 2015-08-28 | Disposition: A | Discharge status: Home / Self Care | Payer: BC Managed Care – PPO | Source: Ambulatory Visit | Attending: Nurse Practitioner | Admitting: Nurse Practitioner

## 2015-08-28 ENCOUNTER — Other Ambulatory Visit: Payer: Self-pay | Admitting: Nurse Practitioner

## 2015-08-28 VITALS — BP 126/70 | HR 86 | Ht 67.0 in | Wt 150.4 lb

## 2015-08-28 DIAGNOSIS — Z01810 Encounter for preprocedural cardiovascular examination: Secondary | ICD-10-CM

## 2015-08-28 LAB — CBC
HCT: 41.3 % (ref 35.0–45.0)
Hemoglobin: 13.9 g/dL (ref 11.7–15.5)
MCH: 31 pg (ref 27.0–33.0)
MCHC: 33.7 g/dL (ref 32.0–36.0)
MCV: 92 fL (ref 80.0–100.0)
MPV: 9.9 fL (ref 7.5–12.5)
Platelets: 311 10*3/uL (ref 140–400)
RBC: 4.49 MIL/uL (ref 3.80–5.10)
RDW: 13.6 % (ref 11.0–15.0)
WBC: 4.1 10*3/uL (ref 3.8–10.8)

## 2015-08-28 LAB — BASIC METABOLIC PANEL
BUN: 18 mg/dL (ref 7–25)
CO2: 28 mmol/L (ref 20–31)
Calcium: 10.3 mg/dL (ref 8.6–10.4)
Chloride: 101 mmol/L (ref 98–110)
Creat: 0.91 mg/dL (ref 0.50–0.99)
Glucose, Bld: 104 mg/dL — ABNORMAL HIGH (ref 65–99)
Potassium: 4.3 mmol/L (ref 3.5–5.3)
Sodium: 139 mmol/L (ref 135–146)

## 2015-08-28 MED ORDER — NITROGLYCERIN 0.4 MG SL SUBL: 0.4000 mg | SUBLINGUAL_TABLET | SUBLINGUAL | Status: DC | PRN
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
## 2015-08-28 NOTE — Patient Instructions (Addendum)
We will be checking the following labs today - BMET, CBC, PT, PTT  Please go to [facility] to [place] on the first floor for a chest Xray - you may walk in.     Medication Instructions:    Continue with your current medicines.  I have sent in a RX for NTG - Use your NTG under your tongue for recurrent chest pain. May take one tablet every 5 minutes. If you are still having discomfort after 3 tablets in 15 minutes, call 911.     Testing/Procedures To Be Arranged:  Cardiac Catheterization with Dr. Darlina Guys     Other Special Instructions:  Limit your activities for the next one week - no strenuous activities, no sex.  Your provider has recommended a cardiac catherization  You are scheduled for a cardiac catheterization on Wednesday, May 24 at [place] Dr. Angelena Form or associate.  Go to [facility] 2nd Floor Short Stay on Wednesday, May 24th at 9:30 AM.  Enter thru the [facility] entrance A No food or drink after midnight on Wednesday. You may take your medications with a sip of water on the day of your procedure.   Coronary Angiogram A coronary angiogram, also called coronary angiography, is an X-ray procedure used to look at the arteries in the heart. In this procedure, a dye (contrast dye) is injected through a long, hollow tube (catheter). The catheter is about the size of a piece of cooked spaghetti and is inserted through your groin, wrist, or arm. The dye is injected into each artery, and X-rays are then taken to show if there is a blockage in the arteries of your heart.  LET [facility] CARE PROVIDER KNOW ABOUT: Any allergies you have, including allergies to shellfish or contrast dye.  All medicines you are taking, including vitamins, herbs, eye drops, creams, and over-the-counter medicines.  Previous problems you or members of your family have had with the use of anesthetics.  Any blood disorders you have.  Previous surgeries you have  had. History of kidney problems or failure.  Other medical conditions you have.  RISKS AND COMPLICATIONS  Generally, a coronary angiogram is a safe procedure. However, about 1 person out of 1000 can have problems that may include: Allergic reaction to the dye. Bleeding/bruising from the access site or other locations. Kidney injury, especially in people with impaired kidney function. Stroke (rare). Heart attack (rare). Irregular rhythms (rare) Death (rare)  BEFORE THE PROCEDURE  Do not eat or drink anything after midnight the night before the procedure or as directed by your health care provider.  Ask your health care provider about changing or stopping your regular medicines. This is especially important if you are taking diabetes medicines or blood thinners.  PROCEDURE You may be given a medicine to help you relax (sedative) before the procedure. This medicine is given through an intravenous (IV) access tube that is inserted into one of your veins.  The area where the catheter will be inserted will be washed and shaved. This is usually done in the groin but may be done in the fold of your arm (near your elbow) or in the wrist.  A medicine will be given to numb the area where the catheter will be inserted (local anesthetic).  The health care provider will insert the catheter into an artery. The catheter will be guided by using a special type of X-ray (fluoroscopy) of the blood vessel being examined.  A special dye will then be  injected into the catheter, and X-rays will be taken. The dye will help to show where any narrowing or blockages are located in the heart arteries.    AFTER THE PROCEDURE  If the procedure is done through the leg, you will be kept in bed lying flat for several hours. You will be instructed to not bend or cross your legs. The insertion site will be checked frequently.  The pulse in your feet or wrist will be checked frequently.  Additional blood tests,  X-rays, and an electrocardiogram may be done.       If you need a refill on your cardiac medications before your next appointment, please call your pharmacy.   Call the [place] office at [phone] if you have any questions, problems or concerns.

## 2015-08-29 LAB — PROTIME-INR
INR: 0.88 (ref <1.50)
Prothrombin Time: 12 seconds (ref 11.6–15.2)

## 2015-08-29 LAB — APTT: aPTT: 26 seconds (ref 24–37)

## 2015-09-03 ENCOUNTER — Telehealth: Payer: Self-pay | Admitting: Cardiology

## 2015-09-03 NOTE — Telephone Encounter (Signed)
Patient concerned because her catheterization paperwork said it will be done with "Dr. Angelena Form or associate." Informed her that best efforts are made to ensure the schedule is followed, but sometimes emergencies occur and another interventionalist will have to do the procedure instead. Patient was grateful for clarification.

## 2015-09-03 NOTE — Telephone Encounter (Signed)
New Message  Pt wanted to speak w/ RN about her upcoming surgery. Please call back and discuss.

## 2015-09-04 ENCOUNTER — Encounter (HOSPITAL_COMMUNITY)
Admission: RE | Disposition: A | Discharge status: Home / Self Care | Payer: Self-pay | Source: Ambulatory Visit | Attending: Cardiovascular Disease

## 2015-09-04 ENCOUNTER — Encounter (HOSPITAL_COMMUNITY): Payer: Self-pay | Admitting: Cardiovascular Disease

## 2015-09-04 ENCOUNTER — Ambulatory Visit (HOSPITAL_COMMUNITY)
Admission: RE | Admit: 2015-09-04 | Discharge: 2015-09-05 | Disposition: A | Discharge status: Home / Self Care | Payer: BC Managed Care – PPO | Source: Ambulatory Visit | Attending: Cardiovascular Disease | Admitting: Cardiovascular Disease

## 2015-09-04 DIAGNOSIS — I25110 Atherosclerotic heart disease of native coronary artery with unstable angina pectoris: Secondary | ICD-10-CM | POA: Insufficient documentation

## 2015-09-04 DIAGNOSIS — Z955 Presence of coronary angioplasty implant and graft: Secondary | ICD-10-CM

## 2015-09-04 DIAGNOSIS — I2584 Coronary atherosclerosis due to calcified coronary lesion: Secondary | ICD-10-CM | POA: Insufficient documentation

## 2015-09-04 DIAGNOSIS — I200 Unstable angina: Secondary | ICD-10-CM | POA: Diagnosis present

## 2015-09-04 DIAGNOSIS — E785 Hyperlipidemia, unspecified: Secondary | ICD-10-CM | POA: Insufficient documentation

## 2015-09-04 DIAGNOSIS — Z79899 Other long term (current) drug therapy: Secondary | ICD-10-CM | POA: Insufficient documentation

## 2015-09-04 DIAGNOSIS — Z8249 Family history of ischemic heart disease and other diseases of the circulatory system: Secondary | ICD-10-CM | POA: Insufficient documentation

## 2015-09-04 HISTORY — DX: Unstable angina: I20.0

## 2015-09-04 HISTORY — DX: Migraine, unspecified, not intractable, without status migrainosus: G43.909

## 2015-09-04 HISTORY — DX: Essential (primary) hypertension: I10

## 2015-09-04 HISTORY — DX: Gastro-esophageal reflux disease without esophagitis: K21.9

## 2015-09-04 HISTORY — DX: Nontoxic single thyroid nodule: E04.1

## 2015-09-04 HISTORY — PX: CARDIAC CATHETERIZATION: SHX172

## 2015-09-04 HISTORY — DX: Atherosclerotic heart disease of native coronary artery without angina pectoris: I25.10

## 2015-09-04 LAB — POCT ACTIVATED CLOTTING TIME
ACTIVATED CLOTTING TIME: 312 s
ACTIVATED CLOTTING TIME: 322 s

## 2015-09-04 SURGERY — LEFT HEART CATH AND CORONARY ANGIOGRAPHY

## 2015-09-04 MED ORDER — ACETAMINOPHEN 325 MG PO TABS
650.0000 mg | ORAL_TABLET | ORAL | Status: DC | PRN
  Administered 2015-09-04: 16:00:00 650 mg via ORAL
  Filled 2015-09-04: qty 2

## 2015-09-04 MED ORDER — HEPARIN SODIUM (PORCINE) 1000 UNIT/ML IJ SOLN
INTRAMUSCULAR | Status: AC
  Filled 2015-09-04: qty 1

## 2015-09-04 MED ORDER — IOPAMIDOL (ISOVUE-370) INJECTION 76%
INTRAVENOUS | Status: AC
  Filled 2015-09-04: qty 100

## 2015-09-04 MED ORDER — NITROGLYCERIN 1 MG/10 ML FOR IR/CATH LAB
INTRA_ARTERIAL | Status: DC | PRN
  Administered 2015-09-04 (×2): 200 ug via INTRACORONARY

## 2015-09-04 MED ORDER — NITROGLYCERIN 1 MG/10 ML FOR IR/CATH LAB
INTRA_ARTERIAL | Status: AC
  Filled 2015-09-04: qty 10

## 2015-09-04 MED ORDER — LIDOCAINE HCL (PF) 1 % IJ SOLN
INTRAMUSCULAR | Status: AC
  Filled 2015-09-04: qty 30

## 2015-09-04 MED ORDER — FENTANYL CITRATE (PF) 100 MCG/2ML IJ SOLN
INTRAMUSCULAR | Status: DC | PRN
  Administered 2015-09-04: 25 ug via INTRAVENOUS

## 2015-09-04 MED ORDER — ENSURE ENLIVE PO LIQD
237.0000 mL | Freq: Two times a day (BID) | ORAL | Status: DC
  Filled 2015-09-04 (×5): qty 237

## 2015-09-04 MED ORDER — MIDAZOLAM HCL 2 MG/2ML IJ SOLN
INTRAMUSCULAR | Status: DC | PRN
  Administered 2015-09-04: 1 mg via INTRAVENOUS

## 2015-09-04 MED ORDER — ASPIRIN 81 MG PO CHEW
CHEWABLE_TABLET | ORAL | Status: AC
  Administered 2015-09-04: 81 mg via ORAL
  Filled 2015-09-04: qty 1

## 2015-09-04 MED ORDER — ONDANSETRON HCL 4 MG/2ML IJ SOLN: 4.0000 mg | Freq: Four times a day (QID) | INTRAMUSCULAR | Status: DC | PRN

## 2015-09-04 MED ORDER — HEPARIN (PORCINE) IN NACL 2-0.9 UNIT/ML-% IJ SOLN
INTRAMUSCULAR | Status: AC
  Filled 2015-09-04: qty 1500

## 2015-09-04 MED ORDER — HEPARIN (PORCINE) IN NACL 2-0.9 UNIT/ML-% IJ SOLN
INTRAMUSCULAR | Status: DC | PRN
  Administered 2015-09-04: 1500 mL

## 2015-09-04 MED ORDER — NITROGLYCERIN 0.4 MG SL SUBL: 0.4000 mg | SUBLINGUAL_TABLET | SUBLINGUAL | Status: DC | PRN

## 2015-09-04 MED ORDER — CLOPIDOGREL BISULFATE 300 MG PO TABS
ORAL_TABLET | ORAL | Status: DC | PRN
  Administered 2015-09-04: 600 mg via ORAL

## 2015-09-04 MED ORDER — MIDAZOLAM HCL 2 MG/2ML IJ SOLN
INTRAMUSCULAR | Status: AC
  Filled 2015-09-04: qty 2

## 2015-09-04 MED ORDER — FENTANYL CITRATE (PF) 100 MCG/2ML IJ SOLN
INTRAMUSCULAR | Status: AC
  Filled 2015-09-04: qty 2

## 2015-09-04 MED ORDER — SODIUM CHLORIDE 0.9% FLUSH: 3.0000 mL | INTRAVENOUS | Status: DC | PRN

## 2015-09-04 MED ORDER — VERAPAMIL HCL 2.5 MG/ML IV SOLN
INTRAVENOUS | Status: DC | PRN
  Administered 2015-09-04: 12:00:00 via INTRA_ARTERIAL

## 2015-09-04 MED ORDER — HYDROCHLOROTHIAZIDE 12.5 MG PO CAPS
12.5000 mg | ORAL_CAPSULE | Freq: Every day | ORAL | Status: DC
  Administered 2015-09-05: 12.5 mg via ORAL
  Filled 2015-09-04 (×2): qty 1

## 2015-09-04 MED ORDER — HEPARIN SODIUM (PORCINE) 1000 UNIT/ML IJ SOLN
INTRAMUSCULAR | Status: DC | PRN
  Administered 2015-09-04: 6500 [IU] via INTRAVENOUS
  Administered 2015-09-04: 3500 [IU] via INTRAVENOUS

## 2015-09-04 MED ORDER — SODIUM CHLORIDE 0.9 % IV SOLN: 250.0000 mL | INTRAVENOUS | Status: DC | PRN

## 2015-09-04 MED ORDER — ASPIRIN 81 MG PO CHEW
81.0000 mg | CHEWABLE_TABLET | Freq: Every day | ORAL | Status: DC
  Administered 2015-09-05: 81 mg via ORAL
  Filled 2015-09-04: qty 1

## 2015-09-04 MED ORDER — HYDROCODONE-ACETAMINOPHEN 5-325 MG PO TABS
1.0000 | ORAL_TABLET | Freq: Four times a day (QID) | ORAL | Status: DC | PRN
  Administered 2015-09-04: 2 via ORAL
  Filled 2015-09-04 (×2): qty 1

## 2015-09-04 MED ORDER — METOPROLOL TARTRATE 12.5 MG HALF TABLET
25.0000 mg | ORAL_TABLET | Freq: Two times a day (BID) | ORAL | Status: DC
  Administered 2015-09-04 – 2015-09-05 (×2): 25 mg via ORAL
  Filled 2015-09-04 (×3): qty 2

## 2015-09-04 MED ORDER — VERAPAMIL HCL 2.5 MG/ML IV SOLN
INTRAVENOUS | Status: AC
  Filled 2015-09-04: qty 2

## 2015-09-04 MED ORDER — DIAZEPAM 5 MG PO TABS
10.0000 mg | ORAL_TABLET | ORAL | Status: AC
  Administered 2015-09-04: 10 mg via ORAL

## 2015-09-04 MED ORDER — POTASSIUM CHLORIDE CRYS ER 20 MEQ PO TBCR
20.0000 meq | EXTENDED_RELEASE_TABLET | Freq: Every day | ORAL | Status: DC
  Administered 2015-09-04 – 2015-09-05 (×2): 20 meq via ORAL
  Filled 2015-09-04 (×2): qty 1

## 2015-09-04 MED ORDER — CLOPIDOGREL BISULFATE 300 MG PO TABS
ORAL_TABLET | ORAL | Status: AC
  Filled 2015-09-04: qty 2

## 2015-09-04 MED ORDER — IOPAMIDOL (ISOVUE-370) INJECTION 76%
INTRAVENOUS | Status: AC
  Filled 2015-09-04: qty 50

## 2015-09-04 MED ORDER — ATORVASTATIN CALCIUM 40 MG PO TABS
40.0000 mg | ORAL_TABLET | Freq: Every day | ORAL | Status: DC
  Administered 2015-09-04: 40 mg via ORAL
  Filled 2015-09-04: qty 1

## 2015-09-04 MED ORDER — CLOPIDOGREL BISULFATE 75 MG PO TABS
75.0000 mg | ORAL_TABLET | Freq: Every day | ORAL | Status: DC
  Administered 2015-09-05: 75 mg via ORAL
  Filled 2015-09-04: qty 1

## 2015-09-04 MED ORDER — SODIUM CHLORIDE 0.9 % IV SOLN: INTRAVENOUS | Status: AC

## 2015-09-04 MED ORDER — ANGIOPLASTY BOOK
Freq: Once | Status: AC
  Administered 2015-09-04: 23:00:00
  Filled 2015-09-04: qty 1

## 2015-09-04 MED ORDER — SODIUM CHLORIDE 0.9 % IV SOLN
INTRAVENOUS | Status: DC
  Administered 2015-09-04: 11:00:00 via INTRAVENOUS

## 2015-09-04 MED ORDER — IOPAMIDOL (ISOVUE-370) INJECTION 76%
INTRAVENOUS | Status: DC | PRN
  Administered 2015-09-04: 190 mL via INTRA_ARTERIAL

## 2015-09-04 MED ORDER — DIAZEPAM 5 MG PO TABS
ORAL_TABLET | ORAL | Status: AC
  Administered 2015-09-04: 10 mg via ORAL
  Filled 2015-09-04: qty 2

## 2015-09-04 MED ORDER — ASPIRIN 81 MG PO CHEW
81.0000 mg | CHEWABLE_TABLET | ORAL | Status: AC
  Administered 2015-09-04: 81 mg via ORAL

## 2015-09-04 MED ORDER — SODIUM CHLORIDE 0.9% FLUSH: 3.0000 mL | Freq: Two times a day (BID) | INTRAVENOUS | Status: DC

## 2015-09-04 MED ORDER — LIDOCAINE HCL (PF) 1 % IJ SOLN
INTRAMUSCULAR | Status: DC | PRN
  Administered 2015-09-04: 3 mL

## 2015-09-04 SURGICAL SUPPLY — 21 items
BALLN MINITREK RX 1.5X8 (BALLOONS) ×2 IMPLANT
BALLN MINITREK RX 2.0X12 (BALLOONS) ×3
BALLN ~~LOC~~ TREK RX 2.75X15 (BALLOONS) ×3
BALLOON MINITREK RX 2.0X12 (BALLOONS) IMPLANT
BALLOON ~~LOC~~ TREK RX 2.75X15 (BALLOONS) IMPLANT
CATH INFINITI 5 FR JL3.5 (CATHETERS) ×2 IMPLANT
CATH INFINITI 5FR ANG PIGTAIL (CATHETERS) ×2 IMPLANT
CATH INFINITI JR4 5F (CATHETERS) ×2 IMPLANT
CATH VISTA GUIDE 6FR XBLAD3.5 (CATHETERS) ×2 IMPLANT
DEVICE RAD COMP TR BAND LRG (VASCULAR PRODUCTS) ×2 IMPLANT
GLIDESHEATH SLEND SS 6F .021 (SHEATH) ×2 IMPLANT
KIT ENCORE 26 ADVANTAGE (KITS) ×2 IMPLANT
KIT HEART LEFT (KITS) ×3 IMPLANT
PACK CARDIAC CATHETERIZATION (CUSTOM PROCEDURE TRAY) ×3 IMPLANT
STENT XIENCE ALPINE RX 2.5X23 (Permanent Stent) ×2 IMPLANT
SYR MEDRAD MARK V 150ML (SYRINGE) ×3 IMPLANT
TRANSDUCER W/STOPCOCK (MISCELLANEOUS) ×3 IMPLANT
TUBING CIL FLEX 10 FLL-RA (TUBING) ×3 IMPLANT
WIRE COUGAR XT STRL 190CM (WIRE) ×4 IMPLANT
WIRE HI TORQ WHISPER MS 190CM (WIRE) ×2 IMPLANT
WIRE SAFE-T 1.5MM-J .035X260CM (WIRE) ×2 IMPLANT

## 2015-09-04 NOTE — Progress Notes (Signed)
TR BAND REMOVAL  LOCATION:    right radial  DEFLATED PER PROTOCOL:    Yes.    TIME BAND OFF / DRESSING APPLIED:    1930   SITE UPON ARRIVAL:    Level 1  SITE AFTER BAND REMOVAL:    Level 1  CIRCULATION SENSATION AND MOVEMENT:    Within Normal Limits   Yes.    COMMENTS:   Bruising noted proximal to site, soft with palpation, tender to touch, no bleeding or hematoma noted. Dressing applied and remained dry and clean.
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
## 2015-09-04 NOTE — Research (Signed)
[place] Study Informed Consent   Subject Name: Christine Walker  Subject met inclusion and exclusion criteria.  The informed consent form, study requirements and expectations were reviewed with the subject and questions and concerns were addressed prior to the signing of the consent form.  The subject verbalized understanding of the trial requirements.  The subject agreed to participate in the trial and signed the informed consent.  The informed consent was obtained prior to performance of any protocol-specific procedures for the subject.  A copy of the signed informed consent was given to the subject and a copy was placed in the subject's medical record.  Blossom Hoops 09/04/2015, 10:33 AM

## 2015-09-04 NOTE — Interval H&P Note (Signed)
History and Physical Interval Note:  09/04/2015 11:00 AM  Christine Walker  has presented today for cardiac cath with the diagnosis of chest pain/unstable angina.  The various methods of treatment have been discussed with the patient and family. After consideration of risks, benefits and other options for treatment, the patient has consented to  Procedure(s): Left Heart Cath and Coronary Angiography (N/A) as a surgical intervention .  The patient's history has been reviewed, patient examined, no change in status, stable for surgery.  I have reviewed the patient's chart and labs.  Questions were answered to the patient's satisfaction.     Lauree Chandler

## 2015-09-05 DIAGNOSIS — I200 Unstable angina: Secondary | ICD-10-CM

## 2015-09-05 DIAGNOSIS — I25110 Atherosclerotic heart disease of native coronary artery with unstable angina pectoris: Secondary | ICD-10-CM | POA: Diagnosis not present

## 2015-09-05 DIAGNOSIS — I2584 Coronary atherosclerosis due to calcified coronary lesion: Secondary | ICD-10-CM | POA: Diagnosis not present

## 2015-09-05 DIAGNOSIS — Z79899 Other long term (current) drug therapy: Secondary | ICD-10-CM | POA: Diagnosis not present

## 2015-09-05 DIAGNOSIS — E785 Hyperlipidemia, unspecified: Secondary | ICD-10-CM | POA: Diagnosis not present

## 2015-09-05 LAB — BASIC METABOLIC PANEL
ANION GAP: 6 (ref 5–15)
BUN: 15 mg/dL (ref 6–20)
CHLORIDE: 105 mmol/L (ref 101–111)
CO2: 27 mmol/L (ref 22–32)
Calcium: 9.2 mg/dL (ref 8.9–10.3)
Creatinine, Ser: 0.76 mg/dL (ref 0.44–1.00)
GFR calc Af Amer: >60 mL/min (ref >60)
GLUCOSE: 116 mg/dL — AB (ref 65–99)
POTASSIUM: 4.7 mmol/L (ref 3.5–5.1)
Sodium: 138 mmol/L (ref 135–145)

## 2015-09-05 LAB — CBC
HEMATOCRIT: 37.9 % (ref 36.0–46.0)
HEMOGLOBIN: 12.4 g/dL (ref 12.0–15.0)
MCH: 30.2 pg (ref 26.0–34.0)
MCHC: 32.7 g/dL (ref 30.0–36.0)
MCV: 92.4 fL (ref 78.0–100.0)
Platelets: 237 10*3/uL (ref 150–400)
RBC: 4.1 MIL/uL (ref 3.87–5.11)
RDW: 12.8 % (ref 11.5–15.5)
WBC: 3 10*3/uL — ABNORMAL LOW (ref 4.0–10.5)

## 2015-09-05 MED ORDER — ATORVASTATIN CALCIUM 40 MG PO TABS: 40.0000 mg | ORAL_TABLET | Freq: Every day | ORAL | Status: DC

## 2015-09-05 MED ORDER — METOPROLOL TARTRATE 25 MG PO TABS: 25.0000 mg | ORAL_TABLET | Freq: Two times a day (BID) | ORAL | Status: DC

## 2015-09-05 MED ORDER — CLOPIDOGREL BISULFATE 75 MG PO TABS: 75.0000 mg | ORAL_TABLET | Freq: Every day | ORAL | Status: DC

## 2015-09-05 MED ORDER — ASPIRIN 81 MG PO CHEW: 81.0000 mg | CHEWABLE_TABLET | Freq: Every day | ORAL | Status: DC

## 2015-09-05 NOTE — Discharge Summary (Signed)
Discharge Summary    Patient ID: Christine Walker,  MRN: [id number], DOB/AGE: [birth year] 65 y.o.  Admit date: 09/04/2015 Discharge date: 09/05/2015  Primary Care Provider: Ann Walker Primary Cardiologist: Christine Walker  Discharge Diagnoses    Active Problems:   Coronary artery disease involving native coronary artery of native heart with unstable angina pectoris [facility])   Unstable angina (HCC)   Allergies No Known Allergies  Diagnostic Studies/Procedures    LHC: 09/04/2015  Procedures    Coronary Stent Intervention   Left Heart Cath and Coronary Angiography    Conclusion     Prox RCA to Mid RCA lesion, 50% stenosed.  Post Atrio lesion, 65% stenosed.  Ost LAD to Prox LAD lesion, 90% stenosed.  Ost 1st Diag to 1st Diag lesion, 99% stenosed.  Dist LAD lesion, 20% stenosed.  The left ventricular systolic function is normal.  1. Severe stenosis proximal LAD, now s/p DES x 1 proximal LAD 2. PTCA/balloon angioplasty only ostium Diagonal 3. Moderate stenosis mid RCA. This does not appear to be flow limiting. Moderate stenosis ostium right posterolateral branch.  4. Normal LV systolic function.   Recommendations: Will continue ASA/Plavix for one year. Will start statin and beta blocker. D/C home tomorrow am if stable.    Coronary Diagrams    Diagnostic Diagram                  _____________   History of Present Illness     Christine Walker is a 65 yo female of Christine Walker with PMH of bacterial endocarditis/asthma/HLD and goiter who presented yesterday for outpatient cath with Christine Walker after being seen in the office multiple times c/o atypical chest pain. She had two previous stress tests that were negative and echo with normal EF. Planned to proceed with LHC after coronary CTA showed Calcified lesion to mid LAD and proximal to mid RCA. Was seen in the office on 08/28/2015 by Christine Walker where Christine Walker was discussed, and planned for  09/04/2015.  Hospital Course     Christine Walker was admitted on 09/04/2015 and underwent left heart cath with coronary angiography with Christine Walker. She was noted to have 90% stenosis to [facility] LAD to Prox LAD with DES x1, and 99% stenosis to ostium diag lesion that was successfully opened post balloon angioplasty. Did have moderate stenosis to mid RCA, along with normal LV systolic function. Recommendations to continue aspirin/Plavix for 1 year, and started on statin along with beta blocker.  She was observed overnight post intervention. Morning labs noted to be stable with a hemoglobin of 12.4, creatinine of 0.76. EKG showed normal sinus rhythm with a rate of 65, with known right bundle-branch block with no acute ST/T-wave abnormalities. She denies any chest pain, or dyspnea post intervention.  Physical Exam: General: Pleasant. Well developed, well nourished and in no acute distress.  HEENT: Normal.  Neck: Supple, no JVD, carotid bruits, or masses noted.  Cardiac: Regular rate and rhythm. No murmurs, rubs, or gallops. No edema.  Respiratory: Lungs are clear to auscultation bilaterally with normal work of breathing.  GI: Soft and nontender.  MS: No deformity or atrophy. Gait and ROM intact.  Skin: Warm and dry. Color is normal. Right radial calf site is stable, without bruising or hematoma. Neuro: Strength and sensation are intact and no gross focal deficits noted.  Psych: Alert, appropriate and with normal affect.  On 09/05/2015 she was seen and assessed by Christine Walker and noted to be  stable for discharge home with follow-up TOC appt made. All vital signs and labs are stable prior to discharge. _____________  Discharge Vitals Blood pressure 132/68, pulse 67, temperature 97.5 F (36.4 C), temperature source Oral, resp. rate 11, height 5\' 7"  (1.702 m), weight 151 lb 7.3 oz (68.7 kg), SpO2 99 %.  Filed Weights   09/04/15 0936 09/05/15 0459  Weight: 150 lb (68.04 kg) 151 lb 7.3 oz (68.7 kg)     Labs & Radiologic Studies    CBC  Recent Labs  09/05/15 0516  WBC 3.0*  HGB 12.4  HCT 37.9  MCV 92.4  PLT 123XX123   Basic Metabolic Panel  Recent Labs  09/05/15 0516  NA 138  K 4.7  CL 105  CO2 27  GLUCOSE 116*  BUN 15  CREATININE 0.76  CALCIUM 9.2   _____________    Disposition   Pt is being discharged home today in good condition.  Follow-up Plans & Appointments    Follow-up Information    Follow up with Christine Dopp, PA-C On 09/12/2015.   Specialties:  Cardiology, Physician Assistant   Why:  11am Dr. Theodosia Blender PA for your hospital follow up   Contact information:   [address] [place] [address] [phone]      Discharge Instructions    Amb Referral to Cardiac Rehabilitation    Complete by:  As directed   Diagnosis:   Coronary Stents PTCA       Diet - low sodium heart healthy    Complete by:  As directed      Increase activity slowly    Complete by:  As directed            Discharge Medications   Current Discharge Medication List    START taking these medications   Details  aspirin 81 MG chewable tablet Chew 1 tablet (81 mg total) by mouth daily.    atorvastatin (LIPITOR) 40 MG tablet Take 1 tablet (40 mg total) by mouth daily at 6 PM. Qty: 30 tablet, Refills: 11    clopidogrel (PLAVIX) 75 MG tablet Take 1 tablet (75 mg total) by mouth daily with breakfast. Qty: 30 tablet, Refills: 11    metoprolol tartrate (LOPRESSOR) 25 MG tablet Take 1 tablet (25 mg total) by mouth 2 (two) times daily. Qty: 30 tablet, Refills: 11      CONTINUE these medications which have NOT CHANGED   Details  Fish Oil OIL Take 2 capsules by mouth daily.     hydrochlorothiazide (MICROZIDE) 12.5 MG capsule TAKE 1 CAPSULE (12.5 MG TOTAL) BY MOUTH DAILY. Qty: 30 capsule, Refills: 5    hydrocortisone cream 1 % Apply 1 application topically daily. Uses for facial discoloration.    lidocaine (LIDODERM) 5 % Place 1 patch onto the skin  daily as needed (For pain.). Remove & Discard patch within 12 hours or as directed by MD    nitroGLYCERIN (NITROSTAT) 0.4 MG SL tablet Place 1 tablet (0.4 mg total) under the tongue every 5 (five) minutes as needed for chest pain. Qty: 25 tablet, Refills: 3    potassium chloride SA (K-DUR,KLOR-CON) 20 MEQ tablet Take 1 tablet (20 mEq total) by mouth daily. Qty: 30 tablet, Refills: 11    ranitidine (ZANTAC) 150 MG tablet Take 1 tablet (150 mg total) by mouth 2 (two) times daily. Qty: 60 tablet, Refills: 5   Associated Diagnoses: Dyspepsia    RELPAX 40 MG tablet TAKE 1 AS NEEDED FOR MIGRAINE. MAY REPEAT  IN 2 HOURS IF NEEDED. MAX OF 2 TABS DAILY Qty: 10 tablet, Refills: 2    TURMERIC PO Take 1 capsule by mouth See admin instructions. She takes one capsule twice daily 2-3 times per week.    valACYclovir (VALTREX) 1000 MG tablet Take 1 tablet (1,000 mg total) by mouth 3 (three) times daily. Qty: 30 tablet, Refills: 0         Aspirin prescribed at discharge?  Yes High Intensity Statin Prescribed? (Lipitor 40-80mg  or Crestor 20-40mg ): Yes Beta Blocker Prescribed? Yes For EF <40%, was ACEI/ARB Prescribed? No:  ADP Receptor Inhibitor Prescribed? (i.e. Plavix etc.-Includes Medically Managed Patients): Yes For EF <40%, Aldosterone Inhibitor Prescribed? No:  Was EF assessed during THIS hospitalization? Yes Was Cardiac Rehab II ordered? (Included Medically managed Patients): Yes   Outstanding Labs/Studies   Check LFTs and Lipid panel in 8 weeks  Duration of Discharge Encounter   Greater than 30 minutes including physician time.  Signed, Reino Bellis NP-C 09/05/2015, 9:35 AM  Agree with note by Reino Bellis NP-C  S/P prox LAD stent. Looks good. No CP. Exam benign. OK for DC home on DAPT. F/U with Dr Christine Walker as op.   Lorretta Harp, M.D., [place], [facility], Laverta [place] [address] [place], [place]   [address]  [phone] 09/05/2015 11:10 AM

## 2015-09-05 NOTE — Care Management Note (Signed)
Case Management Note  Patient Details  Name: CORBYN CABRALES MRN: [id number] Date of Birth: Jan 17, 1951  Subjective/Objective:    Patient is from home, will be on plavix at dc.  NCM will cont to follow for dc needs.                Action/Plan:   Expected Discharge Date:                  Expected Discharge Plan:  Home/Self Care  In-House Referral:     Discharge planning Services  CM Consult  Post Acute Care Choice:    Choice offered to:     DME Arranged:    DME Agency:     HH Arranged:    [place] Agency:     Status of Service:  Completed, signed off  Medicare Important Message Given:    Date Medicare IM Given:    Medicare IM give by:    Date Additional Medicare IM Given:    Additional Medicare Important Message give by:     If discussed at [place] of Stay Meetings, dates discussed:    Additional Comments:  Zenon Mayo, RN 09/05/2015, 10:45 AM

## 2015-09-05 NOTE — Discharge Instructions (Signed)

## 2015-09-05 NOTE — Progress Notes (Signed)
CARDIAC REHAB PHASE I   PRE:  Rate/Rhythm: 66 SR  BP:  Sitting: 114/61        SaO2: 100 RA  MODE:  Ambulation: 600 ft   POST:  Rate/Rhythm: 74 SR  BP:  Sitting: 132/62         SaO2: 100 RA  Pt ambulated 600 ft on RA, independent, steady gait, tolerated well.  Pt c/o feeling "a little tired", denies cp, dizziness, DOE, declined rest stop. Completed PCI/stent education.  Reviewed risk factors, stent book, anti-platelet therapy, stent card, activity restrictions, ntg, exercise, heart healthy diet,  portion control, and phase 2 cardiac rehab. Pt verbalized understanding. Pt agrees to phase 2 cardiac rehab referral, will send to [facility] per pt request. Pt to recliner after walk, call bell within reach.   [id number] Lenna Sciara, RN, BSN 09/05/2015 9:13 AM

## 2015-09-05 NOTE — Telephone Encounter (Signed)
Patient agrees to come to OV on 6/1 fasting to check cholesterol. She was grateful for call.

## 2015-09-05 NOTE — Addendum Note (Signed)
Addended by: Harland German A on: 09/05/2015 05:22 PM   Modules accepted: Orders

## 2015-09-11 ENCOUNTER — Encounter (HOSPITAL_COMMUNITY): Payer: Self-pay | Admitting: Cardiovascular Disease
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
## 2015-09-12 ENCOUNTER — Other Ambulatory Visit: Payer: BC Managed Care – PPO

## 2015-09-12 ENCOUNTER — Encounter: Payer: Self-pay | Admitting: Physician Assistant

## 2015-09-12 ENCOUNTER — Ambulatory Visit (INDEPENDENT_AMBULATORY_CARE_PROVIDER_SITE_OTHER): Payer: BC Managed Care – PPO | Admitting: Physician Assistant

## 2015-09-12 VITALS — BP 120/70 | HR 50 | Ht 67.0 in | Wt 150.0 lb

## 2015-09-12 DIAGNOSIS — I10 Essential (primary) hypertension: Secondary | ICD-10-CM | POA: Diagnosis not present

## 2015-09-12 DIAGNOSIS — E785 Hyperlipidemia, unspecified: Secondary | ICD-10-CM

## 2015-09-12 DIAGNOSIS — I2510 Atherosclerotic heart disease of native coronary artery without angina pectoris: Secondary | ICD-10-CM | POA: Insufficient documentation

## 2015-09-12 DIAGNOSIS — G43009 Migraine without aura, not intractable, without status migrainosus: Secondary | ICD-10-CM

## 2015-09-12 DIAGNOSIS — Z8679 Personal history of other diseases of the circulatory system: Secondary | ICD-10-CM

## 2015-09-12 DIAGNOSIS — I341 Nonrheumatic mitral (valve) prolapse: Secondary | ICD-10-CM | POA: Insufficient documentation

## 2015-09-12 HISTORY — DX: Atherosclerotic heart disease of native coronary artery without angina pectoris: I25.10

## 2015-09-12 HISTORY — DX: Nonrheumatic mitral (valve) prolapse: I34.1

## 2015-09-12 NOTE — Patient Instructions (Addendum)
Medication Instructions:  Your physician recommends that you continue on your current medications as directed. Please refer to the Current Medication list given to you today. Labwork: 10/25/15 FOR FASTING LIPID AND LIVER PANEL Testing/Procedure: NONE Follow-Up: DR. Radford Pax 2-3 MONTHS Any Other Special Instructions Will Be Listed Below (If Applicable). CARDIAC REHAB AT [facility] ; REFERRAL PLACED IN EPIC TODAY If you need a refill on your cardiac medications before your next appointment, please call your pharmacy.

## 2015-09-13 ENCOUNTER — Other Ambulatory Visit: Payer: BC Managed Care – PPO

## 2015-09-24 ENCOUNTER — Encounter: Payer: Self-pay | Admitting: Family Medicine

## 2015-09-24 ENCOUNTER — Ambulatory Visit (INDEPENDENT_AMBULATORY_CARE_PROVIDER_SITE_OTHER): Payer: BC Managed Care – PPO | Admitting: Family Medicine

## 2015-09-24 VITALS — BP 124/76 | HR 59 | Temp 98.6°F | Ht 67.0 in | Wt 151.2 lb

## 2015-09-24 DIAGNOSIS — E785 Hyperlipidemia, unspecified: Secondary | ICD-10-CM | POA: Diagnosis not present

## 2015-09-24 DIAGNOSIS — R739 Hyperglycemia, unspecified: Secondary | ICD-10-CM

## 2015-09-24 DIAGNOSIS — I10 Essential (primary) hypertension: Secondary | ICD-10-CM | POA: Diagnosis not present

## 2015-09-24 DIAGNOSIS — Z8669 Personal history of other diseases of the nervous system and sense organs: Secondary | ICD-10-CM

## 2015-09-24 LAB — COMPREHENSIVE METABOLIC PANEL
ALBUMIN: 4.3 g/dL (ref 3.5–5.2)
ALT: 10 U/L (ref 0–35)
AST: 14 U/L (ref 0–37)
Alkaline Phosphatase: 58 U/L (ref 39–117)
BUN: 18 mg/dL (ref 6–23)
CHLORIDE: 103 meq/L (ref 96–112)
CO2: 30 mEq/L (ref 19–32)
Calcium: 10 mg/dL (ref 8.4–10.5)
Creatinine, Ser: 0.84 mg/dL (ref 0.40–1.20)
GFR: 88.06 mL/min (ref >60.00)
Glucose, Bld: 111 mg/dL — ABNORMAL HIGH (ref 70–99)
POTASSIUM: 4.4 meq/L (ref 3.5–5.1)
SODIUM: 140 meq/L (ref 135–145)
Total Bilirubin: 0.4 mg/dL (ref 0.2–1.2)
Total Protein: 7.3 g/dL (ref 6.0–8.3)

## 2015-09-24 LAB — LIPID PANEL
CHOLESTEROL: 129 mg/dL (ref 0–200)
HDL: 53.3 mg/dL (ref >39.00)
LDL CALC: 59 mg/dL (ref 0–99)
NonHDL: 75.69
TRIGLYCERIDES: 82 mg/dL (ref 0.0–149.0)
Total CHOL/HDL Ratio: 2
VLDL: 16.4 mg/dL (ref 0.0–40.0)

## 2015-09-24 LAB — HEMOGLOBIN A1C: Hgb A1c MFr Bld: 6.8 % — ABNORMAL HIGH (ref 4.6–6.5)

## 2015-09-24 MED ORDER — TRAMADOL HCL 50 MG PO TABS: 50.0000 mg | ORAL_TABLET | Freq: Three times a day (TID) | ORAL | Status: DC | PRN

## 2015-09-24 NOTE — Patient Instructions (Signed)

## 2015-09-24 NOTE — Progress Notes (Signed)
Pre visit review using our clinic review tool, if applicable. No additional management support is needed unless otherwise documented below in the visit note. 
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
## 2015-10-02 ENCOUNTER — Other Ambulatory Visit: Payer: Self-pay | Admitting: Family Medicine

## 2015-10-02 DIAGNOSIS — E1165 Type 2 diabetes mellitus with hyperglycemia: Secondary | ICD-10-CM | POA: Insufficient documentation

## 2015-10-08 ENCOUNTER — Ambulatory Visit (INDEPENDENT_AMBULATORY_CARE_PROVIDER_SITE_OTHER): Payer: BC Managed Care – PPO | Admitting: *Deleted

## 2015-10-08 DIAGNOSIS — IMO0001 Reserved for inherently not codable concepts without codable children: Secondary | ICD-10-CM

## 2015-10-08 DIAGNOSIS — E1165 Type 2 diabetes mellitus with hyperglycemia: Secondary | ICD-10-CM | POA: Diagnosis not present

## 2015-10-08 MED ORDER — ONETOUCH LANCETS MISC: Status: DC

## 2015-10-08 MED ORDER — GLUCOSE BLOOD VI STRP: ORAL_STRIP | Status: DC

## 2015-10-08 NOTE — Progress Notes (Addendum)
Pre visit review using our clinic review tool, if applicable. No additional management support is needed unless otherwise documented below in the visit note.  Pt here today for diabetes and glucometer education after new dx of diabetes per Dr. Carollee Herter. Pt given One Touch Verio glucometer, OneTouch Diabetes and Food Guide, and Tips for Eating Out worksheet. She demonstrated proper use of glucometer and finger stick technique via teachback in office. Provided education regarding diet, nutrition, exercise, and frequency of expected follow-up. She will begin checking daily fasting CBGs at home starting tomorrow morning. She reports she has already started to make dietary and lifestyle changes, as she wants to be able to control her DM by diet and not have to take medications. Pt is interested in meeting w/ diabetic nutritionist. Called pt after visit and left message for her to call insurance company to verify coverage and let us know if she would like to proceed w/ referral.  Dorrene German, RN    Reviewed , agree---- Roma Schanz,  DO

## 2015-10-25 ENCOUNTER — Other Ambulatory Visit (INDEPENDENT_AMBULATORY_CARE_PROVIDER_SITE_OTHER): Payer: BC Managed Care – PPO | Admitting: *Deleted

## 2015-10-25 DIAGNOSIS — E785 Hyperlipidemia, unspecified: Secondary | ICD-10-CM

## 2015-10-25 DIAGNOSIS — I25110 Atherosclerotic heart disease of native coronary artery with unstable angina pectoris: Secondary | ICD-10-CM

## 2015-10-25 LAB — LIPID PANEL
CHOLESTEROL: 149 mg/dL (ref 125–200)
HDL: 68 mg/dL (ref >=46)
LDL CALC: 71 mg/dL (ref <130)
Total CHOL/HDL Ratio: 2.2 Ratio (ref <=5.0)
Triglycerides: 52 mg/dL (ref <150)
VLDL: 10 mg/dL (ref <30)

## 2015-10-25 LAB — HEMOGLOBIN A1C
Hgb A1c MFr Bld: 6.8 % — ABNORMAL HIGH (ref <5.7)
MEAN PLASMA GLUCOSE: 148 mg/dL

## 2015-10-25 LAB — ALT: ALT: 12 U/L (ref 6–29)

## 2015-10-25 NOTE — Addendum Note (Signed)
Addended by: Eulis Foster on: 10/25/2015 08:40 AM   Modules accepted: Orders

## 2015-10-31 ENCOUNTER — Encounter: Payer: Self-pay | Admitting: Cardiology

## 2015-11-25 ENCOUNTER — Ambulatory Visit (INDEPENDENT_AMBULATORY_CARE_PROVIDER_SITE_OTHER): Payer: BC Managed Care – PPO | Admitting: Neurology

## 2015-11-25 ENCOUNTER — Encounter: Payer: Self-pay | Admitting: Neurology

## 2015-11-25 VITALS — BP 122/68 | HR 60 | Ht 67.0 in | Wt 146.0 lb

## 2015-11-25 DIAGNOSIS — G43009 Migraine without aura, not intractable, without status migrainosus: Secondary | ICD-10-CM | POA: Diagnosis not present

## 2015-11-25 NOTE — Patient Instructions (Signed)
Migraine Recommendations: 1.  We don't need to start a daily preventative medication.  Your Lopressor is likely helping to prevent the headaches anyway. 2.  Take tramadol with or without Tylenol 3.  Limit use of pain relievers to no more than 2 days out of the week.  These medications include acetaminophen, ibuprofen, triptans and narcotics.  This will help reduce risk of rebound headaches. 4.  Be aware of common food triggers such as processed sweets, processed foods with nitrites (such as deli meat, hot dogs, sausages), foods with MSG, alcohol (such as wine), chocolate, certain cheeses, certain fruits (dried fruits, some citrus fruit), vinegar, diet soda. 4.  Avoid caffeine 5.  Routine exercise 6.  Proper sleep hygiene 7.  Stay adequately hydrated with water 8.  Keep a headache diary. 9.  Maintain proper stress management. 10.  Do not skip meals. 11.  Consider supplements:  Magnesium oxide 400mg  to 600mg  daily, riboflavin 400mg , Coenzyme Q 10 100mg  three times daily 12.  Follow up in 6 months.
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
## 2015-11-28 ENCOUNTER — Other Ambulatory Visit: Payer: Self-pay | Admitting: Nurse Practitioner

## 2015-11-28 DIAGNOSIS — Z1231 Encounter for screening mammogram for malignant neoplasm of breast: Secondary | ICD-10-CM

## 2015-12-02 ENCOUNTER — Ambulatory Visit
Admission: RE | Admit: 2015-12-02 | Discharge: 2015-12-02 | Disposition: A | Discharge status: Home / Self Care | Payer: BC Managed Care – PPO | Source: Ambulatory Visit | Attending: Nurse Practitioner | Admitting: Nurse Practitioner

## 2015-12-02 DIAGNOSIS — Z1231 Encounter for screening mammogram for malignant neoplasm of breast: Secondary | ICD-10-CM

## 2015-12-05 ENCOUNTER — Telehealth: Payer: Self-pay

## 2015-12-05 NOTE — Telephone Encounter (Signed)
Received Physician's Release Form for Physical Activity from [facility]. Per Dr. Radford Pax, only medically-supervised exercise programs are recommended for now since patient never attended Cardiac Rehab.  Called patient and informed her of Dr. Theodosia Blender recommendations. Informed her Cardiac Rehab will call her soon for an update. Release Form placed at the front desk for patient to pick up at her convenience. Patient was grateful for assistance

## 2015-12-13 ENCOUNTER — Ambulatory Visit: Payer: BC Managed Care – PPO | Admitting: Cardiology

## 2015-12-24 ENCOUNTER — Ambulatory Visit (INDEPENDENT_AMBULATORY_CARE_PROVIDER_SITE_OTHER): Payer: BC Managed Care – PPO | Admitting: Family Medicine

## 2015-12-24 ENCOUNTER — Encounter: Payer: Self-pay | Admitting: Family Medicine

## 2015-12-24 VITALS — BP 126/80 | HR 64 | Temp 98.2°F | Ht 67.0 in | Wt 156.6 lb

## 2015-12-24 DIAGNOSIS — I10 Essential (primary) hypertension: Secondary | ICD-10-CM

## 2015-12-24 DIAGNOSIS — E118 Type 2 diabetes mellitus with unspecified complications: Secondary | ICD-10-CM

## 2015-12-24 DIAGNOSIS — E785 Hyperlipidemia, unspecified: Secondary | ICD-10-CM | POA: Diagnosis not present

## 2015-12-24 DIAGNOSIS — I2510 Atherosclerotic heart disease of native coronary artery without angina pectoris: Secondary | ICD-10-CM | POA: Diagnosis not present

## 2015-12-24 DIAGNOSIS — IMO0002 Reserved for concepts with insufficient information to code with codable children: Secondary | ICD-10-CM

## 2015-12-24 DIAGNOSIS — Z8669 Personal history of other diseases of the nervous system and sense organs: Secondary | ICD-10-CM | POA: Diagnosis not present

## 2015-12-24 DIAGNOSIS — R739 Hyperglycemia, unspecified: Secondary | ICD-10-CM

## 2015-12-24 DIAGNOSIS — E1165 Type 2 diabetes mellitus with hyperglycemia: Secondary | ICD-10-CM

## 2015-12-24 LAB — LIPID PANEL
CHOLESTEROL: 193 mg/dL (ref 0–200)
HDL: 67 mg/dL (ref >39.00)
LDL CALC: 110 mg/dL — AB (ref 0–99)
NonHDL: 126.19
Total CHOL/HDL Ratio: 3
Triglycerides: 80 mg/dL (ref 0.0–149.0)
VLDL: 16 mg/dL (ref 0.0–40.0)

## 2015-12-24 LAB — CBC WITH DIFFERENTIAL/PLATELET
BASOS PCT: 0.4 % (ref 0.0–3.0)
Basophils Absolute: 0 10*3/uL (ref 0.0–0.1)
EOS PCT: 1.8 % (ref 0.0–5.0)
Eosinophils Absolute: 0.1 10*3/uL (ref 0.0–0.7)
HCT: 40.6 % (ref 36.0–46.0)
HEMOGLOBIN: 14 g/dL (ref 12.0–15.0)
LYMPHS ABS: 1.3 10*3/uL (ref 0.7–4.0)
Lymphocytes Relative: 27.7 % (ref 12.0–46.0)
MCHC: 34.4 g/dL (ref 30.0–36.0)
MCV: 90.8 fl (ref 78.0–100.0)
MONO ABS: 0.2 10*3/uL (ref 0.1–1.0)
Monocytes Relative: 5.1 % (ref 3.0–12.0)
NEUTROS ABS: 3 10*3/uL (ref 1.4–7.7)
Neutrophils Relative %: 65 % (ref 43.0–77.0)
PLATELETS: 283 10*3/uL (ref 150.0–400.0)
RBC: 4.47 Mil/uL (ref 3.87–5.11)
RDW: 13.2 % (ref 11.5–15.5)
WBC: 4.6 10*3/uL (ref 4.0–10.5)

## 2015-12-24 LAB — COMPREHENSIVE METABOLIC PANEL
ALT: 11 U/L (ref 0–35)
AST: 16 U/L (ref 0–37)
Albumin: 4.4 g/dL (ref 3.5–5.2)
Alkaline Phosphatase: 55 U/L (ref 39–117)
BUN: 13 mg/dL (ref 6–23)
CHLORIDE: 102 meq/L (ref 96–112)
CO2: 35 meq/L — AB (ref 19–32)
Calcium: 9.7 mg/dL (ref 8.4–10.5)
Creatinine, Ser: 0.9 mg/dL (ref 0.40–1.20)
GFR: 81.25 mL/min (ref >60.00)
GLUCOSE: 110 mg/dL — AB (ref 70–99)
POTASSIUM: 3.9 meq/L (ref 3.5–5.1)
SODIUM: 139 meq/L (ref 135–145)
Total Bilirubin: 0.6 mg/dL (ref 0.2–1.2)
Total Protein: 7.3 g/dL (ref 6.0–8.3)

## 2015-12-24 LAB — POCT URINALYSIS DIPSTICK
BILIRUBIN UA: NEGATIVE
Blood, UA: NEGATIVE
Glucose, UA: NEGATIVE
Ketones, UA: NEGATIVE
LEUKOCYTES UA: NEGATIVE
NITRITE UA: NEGATIVE
PH UA: 6
Protein, UA: NEGATIVE
Spec Grav, UA: 1.025
Urobilinogen, UA: 0.2
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
## 2015-12-24 NOTE — Patient Instructions (Signed)

## 2015-12-24 NOTE — Progress Notes (Signed)
Pre visit review using our clinic review tool, if applicable. No additional management support is needed unless otherwise documented below in the visit note. 

## 2015-12-26 ENCOUNTER — Encounter (HOSPITAL_COMMUNITY): Payer: Self-pay

## 2015-12-26 ENCOUNTER — Encounter (HOSPITAL_COMMUNITY)
Admission: RE | Admit: 2015-12-26 | Discharge: 2015-12-26 | Disposition: A | Discharge status: Home / Self Care | Payer: BC Managed Care – PPO | Source: Ambulatory Visit | Attending: Cardiology | Admitting: Cardiology

## 2015-12-26 VITALS — BP 120/70 | HR 68 | Ht 67.5 in | Wt 156.5 lb

## 2015-12-26 DIAGNOSIS — Z955 Presence of coronary angioplasty implant and graft: Secondary | ICD-10-CM

## 2015-12-26 DIAGNOSIS — I10 Essential (primary) hypertension: Secondary | ICD-10-CM | POA: Insufficient documentation

## 2015-12-26 DIAGNOSIS — I2510 Atherosclerotic heart disease of native coronary artery without angina pectoris: Secondary | ICD-10-CM | POA: Insufficient documentation

## 2015-12-26 DIAGNOSIS — Z95828 Presence of other vascular implants and grafts: Secondary | ICD-10-CM | POA: Diagnosis present

## 2015-12-26 MED ORDER — ATORVASTATIN CALCIUM 40 MG PO TABS: 40.0000 mg | ORAL_TABLET | Freq: Every day | ORAL | 11 refills | Status: DC

## 2015-12-26 MED ORDER — METOPROLOL TARTRATE 25 MG PO TABS: 25.0000 mg | ORAL_TABLET | Freq: Two times a day (BID) | ORAL | 11 refills | Status: DC

## 2015-12-26 MED ORDER — CLOPIDOGREL BISULFATE 75 MG PO TABS: 75.0000 mg | ORAL_TABLET | Freq: Every day | ORAL | 11 refills | Status: DC

## 2015-12-26 MED ORDER — HYDROCHLOROTHIAZIDE 12.5 MG PO CAPS: ORAL_CAPSULE | ORAL | 5 refills | Status: DC

## 2015-12-26 MED ORDER — TRAMADOL HCL 50 MG PO TABS: 50.0000 mg | ORAL_TABLET | Freq: Three times a day (TID) | ORAL | 0 refills | Status: DC | PRN

## 2015-12-26 NOTE — Assessment & Plan Note (Signed)
Check labs  con't diet and exercise 

## 2015-12-26 NOTE — Assessment & Plan Note (Signed)
con't lipitor Check labs 

## 2015-12-26 NOTE — Progress Notes (Signed)
Cardiac Rehab Medication Review by a Pharmacist  Does the patient  feel that his/her medications are working for him/her?  yes  Has the patient been experiencing any side effects to the medications prescribed?  no  Does the patient measure his/her own blood pressure or blood glucose at home?  yes   Does the patient have any problems obtaining medications due to transportation or finances?   no  Understanding of regimen: good Understanding of indications: good Potential of compliance: good    Pharmacist comments: Pt presents for initial cardiac rehab visit. No issues noted, pt reports monitoring blood pressure and blood sugar regularly. Medication list has been updated.   Arrie Senate, PharmD PGY-1 Pharmacy Resident Pager: [phone]
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
## 2015-12-26 NOTE — Assessment & Plan Note (Signed)
Stable con't meds 

## 2015-12-26 NOTE — Assessment & Plan Note (Signed)
Check labs 

## 2015-12-30 ENCOUNTER — Inpatient Hospital Stay (HOSPITAL_COMMUNITY)
Admission: RE | Admit: 2015-12-30 | Discharge: 2015-12-30 | Disposition: A | Discharge status: Home / Self Care | Payer: BC Managed Care – PPO | Source: Ambulatory Visit

## 2016-01-01 ENCOUNTER — Encounter (HOSPITAL_COMMUNITY): Payer: BC Managed Care – PPO

## 2016-01-01 ENCOUNTER — Encounter (HOSPITAL_COMMUNITY)
Admission: RE | Admit: 2016-01-01 | Discharge: 2016-01-01 | Disposition: A | Discharge status: Home / Self Care | Payer: BC Managed Care – PPO | Source: Ambulatory Visit | Attending: Cardiology | Admitting: Cardiology

## 2016-01-01 DIAGNOSIS — Z955 Presence of coronary angioplasty implant and graft: Secondary | ICD-10-CM

## 2016-01-01 DIAGNOSIS — Z95828 Presence of other vascular implants and grafts: Secondary | ICD-10-CM | POA: Diagnosis not present

## 2016-01-01 LAB — GLUCOSE, CAPILLARY
GLUCOSE-CAPILLARY: 128 mg/dL — AB (ref 65–99)
Glucose-Capillary: 200 mg/dL — ABNORMAL HIGH (ref 65–99)

## 2016-01-01 NOTE — Progress Notes (Signed)
Daily Session Note  Patient Details  Name: Christine Walker MRN: [id number] Date of Birth: Jan 02, 1951 Referring Provider:   Flowsheet Row CARDIAC REHAB PHASE II ORIENTATION from 12/26/2015 in [place]  Referring Provider  Fransico Him MD      Encounter Date: 01/01/2016  Check In:   Capillary Blood Glucose: No results found for this or any previous visit (from the past 24 hour(s)).   Goals Met:  Exercise tolerated well  Goals Unmet:  Not Applicable  Comments: Cat  started cardiac rehab today.  Pt tolerated light exercise without difficulty. VSS, telemetry-Sinus rhythm, asymptomatic.  Medication list reconciled. Pt denies barriers to medicaiton compliance.  PSYCHOSOCIAL ASSESSMENT:  PHQ-0. Pt exhibits positive coping skills, hopeful outlook with supportive family. No psychosocial needs identified at this time, no psychosocial interventions necessary.    Pt enjoys reading , play games on phone, good movies and watching TV.Marland Kitchen   Pt oriented to exercise equipment and routine.    Understanding verbalized. Cat is a diet controlled diabetic and is checking her CBG's at home. Will continue to monitor the patient throughout  the program.Maria Venetia Maxon, RN,BSN 01/01/2016 12:39 PM    Dr. Fransico Him is Medical Director for Cardiac Rehab at [facility].

## 2016-01-03 ENCOUNTER — Encounter (HOSPITAL_COMMUNITY)
Admission: RE | Admit: 2016-01-03 | Discharge: 2016-01-03 | Disposition: A | Discharge status: Home / Self Care | Payer: BC Managed Care – PPO | Source: Ambulatory Visit | Attending: Cardiology | Admitting: Cardiology

## 2016-01-03 ENCOUNTER — Encounter (HOSPITAL_COMMUNITY): Payer: BC Managed Care – PPO

## 2016-01-03 DIAGNOSIS — Z955 Presence of coronary angioplasty implant and graft: Secondary | ICD-10-CM

## 2016-01-03 DIAGNOSIS — Z95828 Presence of other vascular implants and grafts: Secondary | ICD-10-CM | POA: Diagnosis not present

## 2016-01-06 ENCOUNTER — Encounter (HOSPITAL_COMMUNITY): Payer: BC Managed Care – PPO

## 2016-01-06 ENCOUNTER — Encounter (HOSPITAL_COMMUNITY)
Admission: RE | Admit: 2016-01-06 | Discharge: 2016-01-06 | Disposition: A | Discharge status: Home / Self Care | Payer: BC Managed Care – PPO | Source: Ambulatory Visit | Attending: Cardiology | Admitting: Cardiology

## 2016-01-06 DIAGNOSIS — Z95828 Presence of other vascular implants and grafts: Secondary | ICD-10-CM | POA: Diagnosis not present

## 2016-01-06 DIAGNOSIS — Z955 Presence of coronary angioplasty implant and graft: Secondary | ICD-10-CM

## 2016-01-06 NOTE — Progress Notes (Signed)
Reviewed home exercise guidelines with patient including endpoints, temperature precautions, target heart rate and rate of perceived exertion. Pt is walking daily as her mode of home exercise. Pt's goal is to achieve 8,000 steps/day. Pt voices understanding of instructions given. Sol Passer, MS, ACSM CCEP

## 2016-01-08 ENCOUNTER — Encounter (HOSPITAL_COMMUNITY): Payer: BC Managed Care – PPO

## 2016-01-08 ENCOUNTER — Encounter (HOSPITAL_COMMUNITY)
Admission: RE | Admit: 2016-01-08 | Discharge: 2016-01-08 | Disposition: A | Discharge status: Home / Self Care | Payer: BC Managed Care – PPO | Source: Ambulatory Visit | Attending: Cardiology | Admitting: Cardiology

## 2016-01-08 DIAGNOSIS — Z95828 Presence of other vascular implants and grafts: Secondary | ICD-10-CM | POA: Diagnosis not present

## 2016-01-10 ENCOUNTER — Encounter (HOSPITAL_COMMUNITY): Payer: BC Managed Care – PPO

## 2016-01-10 ENCOUNTER — Encounter (HOSPITAL_COMMUNITY)
Admission: RE | Admit: 2016-01-10 | Discharge: 2016-01-10 | Disposition: A | Discharge status: Home / Self Care | Payer: BC Managed Care – PPO | Source: Ambulatory Visit | Attending: Cardiology | Admitting: Cardiology

## 2016-01-10 ENCOUNTER — Ambulatory Visit (INDEPENDENT_AMBULATORY_CARE_PROVIDER_SITE_OTHER): Payer: BC Managed Care – PPO | Admitting: Cardiology

## 2016-01-10 ENCOUNTER — Encounter: Payer: Self-pay | Admitting: Cardiology

## 2016-01-10 VITALS — BP 100/80 | HR 64 | Ht 67.0 in | Wt 156.2 lb

## 2016-01-10 DIAGNOSIS — Z955 Presence of coronary angioplasty implant and graft: Secondary | ICD-10-CM

## 2016-01-10 DIAGNOSIS — Z95828 Presence of other vascular implants and grafts: Secondary | ICD-10-CM | POA: Diagnosis not present

## 2016-01-10 DIAGNOSIS — Z8679 Personal history of other diseases of the circulatory system: Secondary | ICD-10-CM | POA: Diagnosis not present

## 2016-01-10 DIAGNOSIS — I10 Essential (primary) hypertension: Secondary | ICD-10-CM

## 2016-01-10 DIAGNOSIS — I2510 Atherosclerotic heart disease of native coronary artery without angina pectoris: Secondary | ICD-10-CM

## 2016-01-10 DIAGNOSIS — E785 Hyperlipidemia, unspecified: Secondary | ICD-10-CM

## 2016-01-10 MED ORDER — ATORVASTATIN CALCIUM 80 MG PO TABS: 80.0000 mg | ORAL_TABLET | Freq: Every day | ORAL | 0 refills | Status: DC
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
## 2016-01-10 NOTE — Patient Instructions (Signed)
Medication Instructions:  Increase Lipitor (atorvastatin) to 80mg  daily. You can take 2 of your 40mg  tablets daily at the same time and use your current supply.  Labwork: Your physician recommends that you return for a FASTING lipid profile /liver profile in 6 weeks.   Testing/Procedures: none  Follow-Up: Your physician wants you to follow-up in: 6 months with Dr Radford Pax. (March 2018). You will receive a reminder letter in the mail two months in advance. If you don't receive a letter, please call our office to schedule the follow-up appointment.        If you need a refill on your cardiac medications before your next appointment, please call your pharmacy.

## 2016-01-10 NOTE — Progress Notes (Signed)
[name] 65 y.o. female Nutrition Note Spoke with pt. Nutrition Plan and Nutrition Survey goals reviewed with pt. Pt is not currently following the Therapeutic Lifestyle Changes diet. Pt had some misconceptions re: a heart healthy diet, which were discussed. Pt is diabetic. Last A1c indicates blood glucose well-controlled.  Pt expressed understanding of the information reviewed. Pt aware of nutrition education classes offered and plans on attending nutrition classes.  Lab Results  Component Value Date   HGBA1C 6.8 (H) 10/25/2015   Wt Readings from Last 3 Encounters:  01/10/16 156 lb 3.2 oz (70.9 kg)  12/26/15 156 lb 8.4 oz (71 kg)  12/24/15 156 lb 9.6 oz (71 kg)   Nutrition Diagnosis ? Food-and nutrition-related knowledge deficit related to lack of exposure to information as related to diagnosis of: ? CVD ? DM  Nutrition Intervention ? Pt's individual nutrition plan reviewed with pt. ? Benefits of adopting Therapeutic Lifestyle Changes discussed when Medficts reviewed. ? Pt to attend the Portion Distortion class ? Pt to attend the Diabetes Q & A class ? Pt to attend the ? Nutrition I class ? Nutrition II class ? Diabetes Blitz class   ? Pt given handouts for: ? Nutrition I class ? Nutrition II class ? Diabetes Blitz Class  ? Continue client-centered nutrition education by RD, as part of interdisciplinary care. Goal(s) ? Pt to identify and limit food sources of saturated fat, trans fat, and sodium Monitor and Evaluate progress toward nutrition goal with team. Derek Mound, M.Ed, RD, LDN, CDE 01/10/2016 2:04 PM

## 2016-01-13 ENCOUNTER — Encounter (HOSPITAL_COMMUNITY)
Admission: RE | Admit: 2016-01-13 | Discharge: 2016-01-13 | Disposition: A | Discharge status: Home / Self Care | Payer: BC Managed Care – PPO | Source: Ambulatory Visit | Attending: Cardiology | Admitting: Cardiology

## 2016-01-13 ENCOUNTER — Encounter (HOSPITAL_COMMUNITY): Payer: BC Managed Care – PPO

## 2016-01-13 DIAGNOSIS — I10 Essential (primary) hypertension: Secondary | ICD-10-CM | POA: Diagnosis not present

## 2016-01-13 DIAGNOSIS — I2510 Atherosclerotic heart disease of native coronary artery without angina pectoris: Secondary | ICD-10-CM | POA: Diagnosis not present

## 2016-01-13 DIAGNOSIS — Z95828 Presence of other vascular implants and grafts: Secondary | ICD-10-CM | POA: Diagnosis present

## 2016-01-13 DIAGNOSIS — Z955 Presence of coronary angioplasty implant and graft: Secondary | ICD-10-CM

## 2016-01-15 ENCOUNTER — Encounter (HOSPITAL_COMMUNITY)
Admission: RE | Admit: 2016-01-15 | Discharge: 2016-01-15 | Disposition: A | Discharge status: Home / Self Care | Payer: BC Managed Care – PPO | Source: Ambulatory Visit | Attending: Cardiology | Admitting: Cardiology

## 2016-01-15 ENCOUNTER — Encounter (HOSPITAL_COMMUNITY): Payer: BC Managed Care – PPO

## 2016-01-15 DIAGNOSIS — Z95828 Presence of other vascular implants and grafts: Secondary | ICD-10-CM | POA: Diagnosis not present

## 2016-01-15 DIAGNOSIS — Z955 Presence of coronary angioplasty implant and graft: Secondary | ICD-10-CM

## 2016-01-17 ENCOUNTER — Encounter (HOSPITAL_COMMUNITY): Payer: BC Managed Care – PPO

## 2016-01-20 ENCOUNTER — Encounter (HOSPITAL_COMMUNITY)
Admission: RE | Admit: 2016-01-20 | Discharge: 2016-01-20 | Disposition: A | Discharge status: Home / Self Care | Payer: BC Managed Care – PPO | Source: Ambulatory Visit | Attending: Cardiology | Admitting: Cardiology

## 2016-01-20 ENCOUNTER — Encounter (HOSPITAL_COMMUNITY): Payer: BC Managed Care – PPO

## 2016-01-20 DIAGNOSIS — Z95828 Presence of other vascular implants and grafts: Secondary | ICD-10-CM | POA: Diagnosis not present

## 2016-01-20 DIAGNOSIS — Z955 Presence of coronary angioplasty implant and graft: Secondary | ICD-10-CM

## 2016-01-22 ENCOUNTER — Encounter (HOSPITAL_COMMUNITY)
Admission: RE | Admit: 2016-01-22 | Discharge: 2016-01-22 | Disposition: A | Discharge status: Home / Self Care | Payer: BC Managed Care – PPO | Source: Ambulatory Visit | Attending: Cardiology | Admitting: Cardiology

## 2016-01-22 ENCOUNTER — Encounter (HOSPITAL_COMMUNITY): Payer: BC Managed Care – PPO

## 2016-01-22 DIAGNOSIS — Z955 Presence of coronary angioplasty implant and graft: Secondary | ICD-10-CM

## 2016-01-22 DIAGNOSIS — Z95828 Presence of other vascular implants and grafts: Secondary | ICD-10-CM | POA: Diagnosis not present
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
## 2016-01-24 ENCOUNTER — Encounter (HOSPITAL_COMMUNITY)
Admission: RE | Admit: 2016-01-24 | Discharge: 2016-01-24 | Disposition: A | Discharge status: Home / Self Care | Payer: BC Managed Care – PPO | Source: Ambulatory Visit | Attending: Cardiology | Admitting: Cardiology

## 2016-01-24 ENCOUNTER — Encounter (HOSPITAL_COMMUNITY): Payer: BC Managed Care – PPO

## 2016-01-24 DIAGNOSIS — Z95828 Presence of other vascular implants and grafts: Secondary | ICD-10-CM | POA: Diagnosis not present

## 2016-01-24 DIAGNOSIS — Z955 Presence of coronary angioplasty implant and graft: Secondary | ICD-10-CM

## 2016-01-27 ENCOUNTER — Encounter (HOSPITAL_COMMUNITY): Payer: BC Managed Care – PPO

## 2016-01-27 ENCOUNTER — Encounter (HOSPITAL_COMMUNITY)
Admission: RE | Admit: 2016-01-27 | Discharge: 2016-01-27 | Disposition: A | Discharge status: Home / Self Care | Payer: BC Managed Care – PPO | Source: Ambulatory Visit | Attending: Cardiology | Admitting: Cardiology

## 2016-01-27 ENCOUNTER — Telehealth: Payer: Self-pay | Admitting: Physician Assistant

## 2016-01-27 ENCOUNTER — Encounter: Payer: Self-pay | Admitting: Cardiology

## 2016-01-27 DIAGNOSIS — Z955 Presence of coronary angioplasty implant and graft: Secondary | ICD-10-CM

## 2016-01-27 DIAGNOSIS — Z95828 Presence of other vascular implants and grafts: Secondary | ICD-10-CM | POA: Diagnosis not present

## 2016-01-27 NOTE — Telephone Encounter (Signed)
     Called by cardiac rehab about a short burst of SVT. Patient could feel heart racing and has had similar sx as outpatient. SBP ~125. Will increase Lopressor 25mg  BID to 37.5mg  BID.    Angelena Form PA-C  [facility]

## 2016-01-27 NOTE — Progress Notes (Signed)
During cool down.  Noted on monitor pt had run of SVT 17 beats   lasting a 5 seconds rate 160's  Bp 124/72. Pt asymptomatic with no complaints of cp or sob.  Pt remarked that she has had this feeling off and on and didn't know what it was.  Called Trish, cardmaster advised to call [place] PA.  Paged [place] and received call back.  Gar Gibbon of pt symptoms.  Pt advised to increase metoprolol to 37.5 mg twice a day.  Pt verbalized understanding and is in agreement of this.  Advised pt to keep log of time a day and duration when she has the "fluttering sensation"  Pt to notify Dr. Radford Pax for any increase in frequency or duration.  Will continue to monitor, pt to return to exercise on Wednesday. Cherre Huger, BSN

## 2016-01-28 ENCOUNTER — Telehealth: Payer: Self-pay

## 2016-01-28 DIAGNOSIS — I471 Supraventricular tachycardia: Secondary | ICD-10-CM

## 2016-01-28 NOTE — Telephone Encounter (Signed)
Left message to call back  

## 2016-01-28 NOTE — Telephone Encounter (Signed)
-----   Message from Sueanne Margarita, MD sent at 01/28/2016 11:45 AM EDT ----- Christine Walker,  Please also order a 30 day monitor to make sure she is not having any PAF as well  Traci ----- Message ----- From: Rowe Pavy, RN Sent: 01/27/2016  12:58 PM To: Sueanne Margarita, MD, Theodoro Parma, RN  Juluis Rainier

## 2016-01-29 ENCOUNTER — Encounter (HOSPITAL_COMMUNITY)
Admission: RE | Admit: 2016-01-29 | Discharge: 2016-01-29 | Disposition: A | Discharge status: Home / Self Care | Payer: BC Managed Care – PPO | Source: Ambulatory Visit | Attending: Cardiology | Admitting: Cardiology

## 2016-01-29 ENCOUNTER — Encounter (HOSPITAL_COMMUNITY): Payer: BC Managed Care – PPO

## 2016-01-29 DIAGNOSIS — Z955 Presence of coronary angioplasty implant and graft: Secondary | ICD-10-CM

## 2016-01-29 DIAGNOSIS — Z95828 Presence of other vascular implants and grafts: Secondary | ICD-10-CM | POA: Diagnosis not present

## 2016-01-29 NOTE — Telephone Encounter (Signed)
Monitor ordered for scheduling. Patient agrees with treatment plan.

## 2016-01-29 NOTE — Telephone Encounter (Signed)
Follow up ° ° °Pt verbalized that she is returning call for rn °

## 2016-01-31 ENCOUNTER — Encounter (HOSPITAL_COMMUNITY): Payer: BC Managed Care – PPO

## 2016-01-31 ENCOUNTER — Encounter (HOSPITAL_COMMUNITY)
Admission: RE | Admit: 2016-01-31 | Discharge: 2016-01-31 | Disposition: A | Discharge status: Home / Self Care | Payer: BC Managed Care – PPO | Source: Ambulatory Visit | Attending: Cardiology | Admitting: Cardiology

## 2016-01-31 DIAGNOSIS — Z955 Presence of coronary angioplasty implant and graft: Secondary | ICD-10-CM

## 2016-02-03 ENCOUNTER — Encounter (HOSPITAL_COMMUNITY): Payer: BC Managed Care – PPO

## 2016-02-03 ENCOUNTER — Encounter (HOSPITAL_COMMUNITY)
Admission: RE | Admit: 2016-02-03 | Discharge: 2016-02-03 | Disposition: A | Discharge status: Home / Self Care | Payer: BC Managed Care – PPO | Source: Ambulatory Visit | Attending: Cardiology | Admitting: Cardiology

## 2016-02-03 DIAGNOSIS — Z95828 Presence of other vascular implants and grafts: Secondary | ICD-10-CM | POA: Diagnosis not present

## 2016-02-03 DIAGNOSIS — Z955 Presence of coronary angioplasty implant and graft: Secondary | ICD-10-CM

## 2016-02-03 NOTE — Telephone Encounter (Signed)
Event monitor is scheduled 10/31.

## 2016-02-05 ENCOUNTER — Encounter (HOSPITAL_COMMUNITY)
Admission: RE | Admit: 2016-02-05 | Discharge: 2016-02-05 | Disposition: A | Discharge status: Home / Self Care | Payer: BC Managed Care – PPO | Source: Ambulatory Visit | Attending: Cardiology | Admitting: Cardiology

## 2016-02-05 ENCOUNTER — Encounter (HOSPITAL_COMMUNITY): Payer: BC Managed Care – PPO

## 2016-02-05 DIAGNOSIS — Z95828 Presence of other vascular implants and grafts: Secondary | ICD-10-CM | POA: Diagnosis not present

## 2016-02-05 DIAGNOSIS — Z955 Presence of coronary angioplasty implant and graft: Secondary | ICD-10-CM

## 2016-02-07 ENCOUNTER — Encounter (HOSPITAL_COMMUNITY): Payer: BC Managed Care – PPO

## 2016-02-10 ENCOUNTER — Encounter (HOSPITAL_COMMUNITY): Payer: BC Managed Care – PPO

## 2016-02-10 ENCOUNTER — Encounter (HOSPITAL_COMMUNITY)
Admission: RE | Admit: 2016-02-10 | Discharge: 2016-02-10 | Disposition: A | Discharge status: Home / Self Care | Payer: BC Managed Care – PPO | Source: Ambulatory Visit | Attending: Cardiology | Admitting: Cardiology

## 2016-02-10 DIAGNOSIS — Z955 Presence of coronary angioplasty implant and graft: Secondary | ICD-10-CM

## 2016-02-10 DIAGNOSIS — Z95828 Presence of other vascular implants and grafts: Secondary | ICD-10-CM | POA: Diagnosis not present

## 2016-02-11 ENCOUNTER — Ambulatory Visit (INDEPENDENT_AMBULATORY_CARE_PROVIDER_SITE_OTHER): Payer: BC Managed Care – PPO

## 2016-02-11 DIAGNOSIS — I471 Supraventricular tachycardia: Secondary | ICD-10-CM

## 2016-02-12 ENCOUNTER — Encounter (HOSPITAL_COMMUNITY)
Admission: RE | Admit: 2016-02-12 | Discharge: 2016-02-12 | Disposition: A | Discharge status: Home / Self Care | Payer: BC Managed Care – PPO | Source: Ambulatory Visit | Attending: Cardiology | Admitting: Cardiology

## 2016-02-12 ENCOUNTER — Encounter (HOSPITAL_COMMUNITY): Payer: BC Managed Care – PPO

## 2016-02-12 DIAGNOSIS — Z95828 Presence of other vascular implants and grafts: Secondary | ICD-10-CM | POA: Diagnosis present

## 2016-02-12 DIAGNOSIS — Z955 Presence of coronary angioplasty implant and graft: Secondary | ICD-10-CM

## 2016-02-12 DIAGNOSIS — I10 Essential (primary) hypertension: Secondary | ICD-10-CM | POA: Insufficient documentation

## 2016-02-12 DIAGNOSIS — I2510 Atherosclerotic heart disease of native coronary artery without angina pectoris: Secondary | ICD-10-CM | POA: Insufficient documentation

## 2016-02-14 ENCOUNTER — Telehealth (HOSPITAL_COMMUNITY): Payer: Self-pay | Admitting: Family Medicine

## 2016-02-14 ENCOUNTER — Encounter (HOSPITAL_COMMUNITY): Payer: BC Managed Care – PPO

## 2016-02-17 ENCOUNTER — Encounter (HOSPITAL_COMMUNITY): Payer: BC Managed Care – PPO

## 2016-02-19 ENCOUNTER — Encounter (HOSPITAL_COMMUNITY): Payer: BC Managed Care – PPO
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
## 2016-02-21 ENCOUNTER — Encounter (HOSPITAL_COMMUNITY): Payer: BC Managed Care – PPO

## 2016-02-24 ENCOUNTER — Encounter (HOSPITAL_COMMUNITY): Payer: BC Managed Care – PPO

## 2016-02-25 ENCOUNTER — Other Ambulatory Visit: Payer: BC Managed Care – PPO

## 2016-02-26 ENCOUNTER — Encounter (HOSPITAL_COMMUNITY): Payer: BC Managed Care – PPO

## 2016-02-28 ENCOUNTER — Encounter (HOSPITAL_COMMUNITY): Payer: BC Managed Care – PPO

## 2016-03-02 ENCOUNTER — Encounter (HOSPITAL_COMMUNITY): Payer: BC Managed Care – PPO

## 2016-03-02 ENCOUNTER — Encounter (HOSPITAL_COMMUNITY)
Admission: RE | Admit: 2016-03-02 | Discharge: 2016-03-02 | Disposition: A | Discharge status: Home / Self Care | Payer: BC Managed Care – PPO | Source: Ambulatory Visit | Attending: Cardiology | Admitting: Cardiology

## 2016-03-02 DIAGNOSIS — Z95828 Presence of other vascular implants and grafts: Secondary | ICD-10-CM | POA: Diagnosis not present

## 2016-03-02 DIAGNOSIS — Z955 Presence of coronary angioplasty implant and graft: Secondary | ICD-10-CM

## 2016-03-03 ENCOUNTER — Other Ambulatory Visit: Payer: BC Managed Care – PPO

## 2016-03-04 ENCOUNTER — Encounter (HOSPITAL_COMMUNITY): Payer: BC Managed Care – PPO

## 2016-03-04 ENCOUNTER — Telehealth: Payer: Self-pay | Admitting: *Deleted

## 2016-03-04 ENCOUNTER — Encounter (HOSPITAL_COMMUNITY)
Admission: RE | Admit: 2016-03-04 | Discharge: 2016-03-04 | Disposition: A | Discharge status: Home / Self Care | Payer: BC Managed Care – PPO | Source: Ambulatory Visit | Attending: Cardiology | Admitting: Cardiology

## 2016-03-04 DIAGNOSIS — I10 Essential (primary) hypertension: Secondary | ICD-10-CM

## 2016-03-04 DIAGNOSIS — Z955 Presence of coronary angioplasty implant and graft: Secondary | ICD-10-CM

## 2016-03-04 DIAGNOSIS — Z95828 Presence of other vascular implants and grafts: Secondary | ICD-10-CM | POA: Diagnosis not present

## 2016-03-04 MED ORDER — METOPROLOL TARTRATE 25 MG PO TABS: 37.5000 mg | ORAL_TABLET | Freq: Two times a day (BID) | ORAL | 5 refills | Status: DC

## 2016-03-04 NOTE — Telephone Encounter (Signed)
Received fax from [facility] requesting Rx with new directions of metopolol. Take 1 and 1/2 tablets by mouth twice a day. Rx sent.

## 2016-03-09 ENCOUNTER — Encounter (HOSPITAL_COMMUNITY): Payer: BC Managed Care – PPO

## 2016-03-09 ENCOUNTER — Encounter (HOSPITAL_COMMUNITY)
Admission: RE | Admit: 2016-03-09 | Discharge: 2016-03-09 | Disposition: A | Discharge status: Home / Self Care | Payer: BC Managed Care – PPO | Source: Ambulatory Visit | Attending: Cardiology | Admitting: Cardiology

## 2016-03-09 DIAGNOSIS — Z95828 Presence of other vascular implants and grafts: Secondary | ICD-10-CM | POA: Diagnosis not present

## 2016-03-09 DIAGNOSIS — Z955 Presence of coronary angioplasty implant and graft: Secondary | ICD-10-CM

## 2016-03-10 ENCOUNTER — Other Ambulatory Visit: Payer: BC Managed Care – PPO | Admitting: *Deleted

## 2016-03-10 DIAGNOSIS — Z8679 Personal history of other diseases of the circulatory system: Secondary | ICD-10-CM

## 2016-03-10 DIAGNOSIS — I10 Essential (primary) hypertension: Secondary | ICD-10-CM

## 2016-03-10 DIAGNOSIS — E785 Hyperlipidemia, unspecified: Secondary | ICD-10-CM

## 2016-03-10 DIAGNOSIS — I2510 Atherosclerotic heart disease of native coronary artery without angina pectoris: Secondary | ICD-10-CM

## 2016-03-10 LAB — LIPID PANEL
Cholesterol: 126 mg/dL (ref <200)
HDL: 66 mg/dL (ref >50)
LDL CALC: 47 mg/dL (ref <100)
Total CHOL/HDL Ratio: 1.9 Ratio (ref <5.0)
Triglycerides: 66 mg/dL (ref <150)
VLDL: 13 mg/dL (ref <30)

## 2016-03-10 LAB — HEPATIC FUNCTION PANEL
ALBUMIN: 4.2 g/dL (ref 3.6–5.1)
ALK PHOS: 67 U/L (ref 33–130)
ALT: 12 U/L (ref 6–29)
AST: 17 U/L (ref 10–35)
BILIRUBIN TOTAL: 0.5 mg/dL (ref 0.2–1.2)
Bilirubin, Direct: 0.1 mg/dL (ref <=0.2)
Indirect Bilirubin: 0.4 mg/dL (ref 0.2–1.2)
Total Protein: 6.6 g/dL (ref 6.1–8.1)

## 2016-03-11 ENCOUNTER — Encounter (HOSPITAL_COMMUNITY): Payer: BC Managed Care – PPO

## 2016-03-11 ENCOUNTER — Encounter (HOSPITAL_COMMUNITY)
Admission: RE | Admit: 2016-03-11 | Discharge: 2016-03-11 | Disposition: A | Discharge status: Home / Self Care | Payer: BC Managed Care – PPO | Source: Ambulatory Visit | Attending: Cardiology | Admitting: Cardiology

## 2016-03-11 DIAGNOSIS — Z95828 Presence of other vascular implants and grafts: Secondary | ICD-10-CM | POA: Diagnosis not present

## 2016-03-11 DIAGNOSIS — Z955 Presence of coronary angioplasty implant and graft: Secondary | ICD-10-CM

## 2016-03-13 ENCOUNTER — Encounter (HOSPITAL_COMMUNITY)
Admission: RE | Admit: 2016-03-13 | Discharge: 2016-03-13 | Disposition: A | Discharge status: Home / Self Care | Payer: BC Managed Care – PPO | Source: Ambulatory Visit | Attending: Cardiology | Admitting: Cardiology

## 2016-03-13 ENCOUNTER — Encounter (HOSPITAL_COMMUNITY): Payer: BC Managed Care – PPO

## 2016-03-13 DIAGNOSIS — Z95828 Presence of other vascular implants and grafts: Secondary | ICD-10-CM | POA: Insufficient documentation

## 2016-03-13 DIAGNOSIS — I10 Essential (primary) hypertension: Secondary | ICD-10-CM | POA: Diagnosis not present

## 2016-03-13 DIAGNOSIS — I2510 Atherosclerotic heart disease of native coronary artery without angina pectoris: Secondary | ICD-10-CM | POA: Diagnosis not present

## 2016-03-13 DIAGNOSIS — Z955 Presence of coronary angioplasty implant and graft: Secondary | ICD-10-CM

## 2016-03-16 ENCOUNTER — Encounter (HOSPITAL_COMMUNITY): Payer: BC Managed Care – PPO

## 2016-03-16 ENCOUNTER — Encounter (HOSPITAL_COMMUNITY)
Admission: RE | Admit: 2016-03-16 | Discharge: 2016-03-16 | Disposition: A | Discharge status: Home / Self Care | Payer: BC Managed Care – PPO | Source: Ambulatory Visit | Attending: Cardiology | Admitting: Cardiology

## 2016-03-16 DIAGNOSIS — Z955 Presence of coronary angioplasty implant and graft: Secondary | ICD-10-CM

## 2016-03-16 DIAGNOSIS — Z95828 Presence of other vascular implants and grafts: Secondary | ICD-10-CM | POA: Diagnosis not present

## 2016-03-18 ENCOUNTER — Encounter (HOSPITAL_COMMUNITY): Payer: BC Managed Care – PPO

## 2016-03-18 ENCOUNTER — Telehealth: Payer: Self-pay | Admitting: Cardiology

## 2016-03-18 ENCOUNTER — Encounter (HOSPITAL_COMMUNITY)
Admission: RE | Admit: 2016-03-18 | Discharge: 2016-03-18 | Disposition: A | Discharge status: Home / Self Care | Payer: BC Managed Care – PPO | Source: Ambulatory Visit | Attending: Cardiology | Admitting: Cardiology

## 2016-03-18 DIAGNOSIS — Z955 Presence of coronary angioplasty implant and graft: Secondary | ICD-10-CM

## 2016-03-18 DIAGNOSIS — Z95828 Presence of other vascular implants and grafts: Secondary | ICD-10-CM | POA: Diagnosis not present

## 2016-03-18 NOTE — Telephone Encounter (Signed)
New message  Pt c/o medication issue:  1. Name of Medication: metoprolol  2. How are you currently taking this medication (dosage and times per day)? 25mg   3. Are you having a reaction (difficulty breathing--STAT)? no  4. What is your medication issue?  Pt call requesting to speak with RN. Pt states he bp numbers have decreased and pt would like to know does she need to continue taking the medication 1 1/2 pills daily or does she need to return to 2x daily.   Pt states if the medication dosage will stay the say the pharmacy states the dosage for the pharmacy will need to be changed. Please call back to discuss

## 2016-03-18 NOTE — Telephone Encounter (Signed)
Left msg that I will forward her call to her Dr/nurse.

## 2016-03-19 NOTE — Telephone Encounter (Signed)
Left message for patient to call back with a list of her BP readings available to discuss.

## 2016-03-19 NOTE — Progress Notes (Signed)
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 keeps her from sleeping well at night.   Expected Outcomes Pt will start to feel better and get back to exercise routine.      ITP Comments:     ITP Comments    Row Name 12/26/15 1639           ITP Comments Dr. Radford Pax medical director          Comments:  Pt is making expected progress toward personal goals after completing   25 sessions. Pt completed wearing holter monitor. Awaiting results. Pt appears less anxious and observed interacting positively with fellow participants.Repeat Psychosocial Assessment: Pt with supportive family, denies any Psychosocial needs or interventions at this time. Recommend continued exercise and life style modification education including  stress management and relaxation techniques to decrease cardiac risk profile. Cherre Huger, BSN

## 2016-03-20 ENCOUNTER — Encounter (HOSPITAL_COMMUNITY)
Admission: RE | Admit: 2016-03-20 | Discharge: 2016-03-20 | Disposition: A | Discharge status: Home / Self Care | Payer: BC Managed Care – PPO | Source: Ambulatory Visit | Attending: Cardiology | Admitting: Cardiology

## 2016-03-20 ENCOUNTER — Encounter (HOSPITAL_COMMUNITY): Payer: BC Managed Care – PPO

## 2016-03-20 DIAGNOSIS — Z955 Presence of coronary angioplasty implant and graft: Secondary | ICD-10-CM

## 2016-03-20 DIAGNOSIS — Z95828 Presence of other vascular implants and grafts: Secondary | ICD-10-CM | POA: Diagnosis not present

## 2016-03-23 ENCOUNTER — Encounter (HOSPITAL_COMMUNITY): Payer: BC Managed Care – PPO

## 2016-03-25 ENCOUNTER — Encounter (HOSPITAL_COMMUNITY): Payer: BC Managed Care – PPO

## 2016-03-27 ENCOUNTER — Encounter (HOSPITAL_COMMUNITY)
Admission: RE | Admit: 2016-03-27 | Discharge: 2016-03-27 | Disposition: A | Discharge status: Home / Self Care | Payer: BC Managed Care – PPO | Source: Ambulatory Visit | Attending: Cardiology | Admitting: Cardiology

## 2016-03-27 ENCOUNTER — Other Ambulatory Visit: Payer: Self-pay | Admitting: *Deleted

## 2016-03-27 ENCOUNTER — Telehealth: Payer: Self-pay | Admitting: Cardiology

## 2016-03-27 ENCOUNTER — Encounter (HOSPITAL_COMMUNITY): Payer: BC Managed Care – PPO

## 2016-03-27 DIAGNOSIS — I10 Essential (primary) hypertension: Secondary | ICD-10-CM

## 2016-03-27 DIAGNOSIS — Z955 Presence of coronary angioplasty implant and graft: Secondary | ICD-10-CM

## 2016-03-27 DIAGNOSIS — Z95828 Presence of other vascular implants and grafts: Secondary | ICD-10-CM | POA: Diagnosis not present

## 2016-03-27 MED ORDER — METOPROLOL TARTRATE 50 MG PO TABS: 50.0000 mg | ORAL_TABLET | Freq: Two times a day (BID) | ORAL | 11 refills | Status: DC

## 2016-03-27 NOTE — Telephone Encounter (Signed)
New message ° ° ° ° °Returning a call to the nurse °

## 2016-03-30 ENCOUNTER — Encounter (HOSPITAL_COMMUNITY)
Admission: RE | Admit: 2016-03-30 | Discharge: 2016-03-30 | Disposition: A | Discharge status: Home / Self Care | Payer: BC Managed Care – PPO | Source: Ambulatory Visit | Attending: Cardiology | Admitting: Cardiology

## 2016-03-30 ENCOUNTER — Encounter (HOSPITAL_COMMUNITY): Payer: BC Managed Care – PPO

## 2016-03-30 DIAGNOSIS — Z955 Presence of coronary angioplasty implant and graft: Secondary | ICD-10-CM

## 2016-04-01 ENCOUNTER — Encounter (HOSPITAL_COMMUNITY)
Admission: RE | Admit: 2016-04-01 | Discharge: 2016-04-01 | Disposition: A | Discharge status: Home / Self Care | Payer: BC Managed Care – PPO | Source: Ambulatory Visit | Attending: Cardiology | Admitting: Cardiology

## 2016-04-01 ENCOUNTER — Encounter (HOSPITAL_COMMUNITY): Payer: BC Managed Care – PPO

## 2016-04-01 DIAGNOSIS — Z955 Presence of coronary angioplasty implant and graft: Secondary | ICD-10-CM

## 2016-04-01 DIAGNOSIS — Z95828 Presence of other vascular implants and grafts: Secondary | ICD-10-CM | POA: Diagnosis not present

## 2016-04-03 ENCOUNTER — Encounter (HOSPITAL_COMMUNITY): Payer: BC Managed Care – PPO

## 2016-04-08 ENCOUNTER — Encounter (HOSPITAL_COMMUNITY): Payer: BC Managed Care – PPO

## 2016-04-09 NOTE — Telephone Encounter (Signed)
Left message to call back if she still has medication concerns or questions.

## 2016-04-10 ENCOUNTER — Encounter (HOSPITAL_COMMUNITY): Payer: BC Managed Care – PPO

## 2016-04-11 ENCOUNTER — Other Ambulatory Visit: Payer: Self-pay | Admitting: Cardiology

## 2016-04-11 DIAGNOSIS — E785 Hyperlipidemia, unspecified: Secondary | ICD-10-CM

## 2016-04-11 DIAGNOSIS — I2510 Atherosclerotic heart disease of native coronary artery without angina pectoris: Secondary | ICD-10-CM

## 2016-04-11 DIAGNOSIS — I10 Essential (primary) hypertension: Secondary | ICD-10-CM

## 2016-04-11 DIAGNOSIS — Z8679 Personal history of other diseases of the circulatory system: Secondary | ICD-10-CM

## 2016-04-15 ENCOUNTER — Encounter (HOSPITAL_COMMUNITY)
Admission: RE | Admit: 2016-04-15 | Discharge: 2016-04-15 | Disposition: A | Discharge status: Home / Self Care | Payer: BC Managed Care – PPO | Source: Ambulatory Visit | Attending: Cardiology | Admitting: Cardiology

## 2016-04-15 ENCOUNTER — Encounter (HOSPITAL_COMMUNITY): Payer: BC Managed Care – PPO

## 2016-04-15 DIAGNOSIS — Z955 Presence of coronary angioplasty implant and graft: Secondary | ICD-10-CM

## 2016-04-15 DIAGNOSIS — I10 Essential (primary) hypertension: Secondary | ICD-10-CM | POA: Diagnosis not present

## 2016-04-15 DIAGNOSIS — Z95828 Presence of other vascular implants and grafts: Secondary | ICD-10-CM | POA: Diagnosis not present

## 2016-04-15 DIAGNOSIS — I2510 Atherosclerotic heart disease of native coronary artery without angina pectoris: Secondary | ICD-10-CM | POA: Diagnosis not present

## 2016-04-16 NOTE — Progress Notes (Signed)
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 Risk Factor Review    Row Name 01/21/16 0940 03/13/16 1045 04/14/16 1137         Core Components/Risk Factors/Patient Goals Review   Personal Goals Review Other Other Increase Strength and Stamina;Other     Review Participant is off to a good start with exercise. Walking daily and increasing workloads at cardiac  rehab steadily. Pt c/o of fatigue and being tired. Pt has been sick with a cold and is recovering,  Pt also has a cough that keeps her from sleeping well at night. Pt reported feeling better and having improved strength/stamina. Pt is stretching everyday and walks 5-10,000 steps per day.     Expected Outcomes Maintain regular exercise routine to help improve cardiorespiratory fitness. Pt will start to feel better and get back to exercise routine. Pt will continue with exercise and activity levels to improve aerobic fitness and exercise tolerance        Core Components/Risk Factors/Patient Goals at Discharge (Final Review):      Goals and Risk Factor Review - 04/14/16 1137      Core Components/Risk Factors/Patient Goals Review   Personal Goals Review Increase Strength and Stamina;Other   Review Pt reported feeling better and having improved strength/stamina. Pt is stretching everyday and walks 5-10,000 steps per day.   Expected Outcomes Pt will continue with exercise and activity levels to improve aerobic fitness and exercise tolerance      ITP Comments:     ITP Comments    Row Name 12/26/15 1639           ITP Comments Dr. Radford Pax medical director          Comments:  Pt is making expected progress toward personal goals after completing 29 sessions. Pt recently returned from visiting family in [place]. RepeatPsychosocial Assessment: Pt with supportive family, denies any Psychosocial needs or interventions at this time. Pt observed interacting with fellow participants more and engaging with rehab staff.  Recommend continued exercise and life style modification education including  stress management and relaxation techniques to decrease cardiac risk profile. Cherre Huger, BSN

## 2016-04-17 ENCOUNTER — Encounter (HOSPITAL_COMMUNITY)
Admission: RE | Admit: 2016-04-17 | Discharge: 2016-04-17 | Disposition: A | Discharge status: Home / Self Care | Payer: BC Managed Care – PPO | Source: Ambulatory Visit | Attending: Cardiology | Admitting: Cardiology

## 2016-04-17 ENCOUNTER — Encounter (HOSPITAL_COMMUNITY): Payer: BC Managed Care – PPO

## 2016-04-17 DIAGNOSIS — Z955 Presence of coronary angioplasty implant and graft: Secondary | ICD-10-CM

## 2016-04-17 DIAGNOSIS — Z95828 Presence of other vascular implants and grafts: Secondary | ICD-10-CM | POA: Diagnosis not present

## 2016-04-20 ENCOUNTER — Ambulatory Visit (INDEPENDENT_AMBULATORY_CARE_PROVIDER_SITE_OTHER): Payer: BC Managed Care – PPO | Admitting: Physician Assistant

## 2016-04-20 ENCOUNTER — Encounter (HOSPITAL_COMMUNITY): Payer: BC Managed Care – PPO

## 2016-04-20 ENCOUNTER — Encounter: Payer: Self-pay | Admitting: Physician Assistant

## 2016-04-20 VITALS — BP 132/79 | HR 55 | Temp 98.3°F | Resp 16 | Ht 67.0 in | Wt 160.2 lb

## 2016-04-20 DIAGNOSIS — IMO0002 Reserved for concepts with insufficient information to code with codable children: Secondary | ICD-10-CM

## 2016-04-20 DIAGNOSIS — E118 Type 2 diabetes mellitus with unspecified complications: Secondary | ICD-10-CM | POA: Diagnosis not present

## 2016-04-20 DIAGNOSIS — M545 Low back pain, unspecified: Secondary | ICD-10-CM

## 2016-04-20 DIAGNOSIS — R519 Headache, unspecified: Secondary | ICD-10-CM

## 2016-04-20 DIAGNOSIS — R51 Headache: Secondary | ICD-10-CM

## 2016-04-20 DIAGNOSIS — E1165 Type 2 diabetes mellitus with hyperglycemia: Secondary | ICD-10-CM

## 2016-04-20 MED ORDER — CYCLOBENZAPRINE HCL 5 MG PO TABS: 5.0000 mg | ORAL_TABLET | Freq: Three times a day (TID) | ORAL | 0 refills | Status: DC | PRN

## 2016-04-20 MED ORDER — METHYLPREDNISOLONE 4 MG PO TBPK: ORAL_TABLET | ORAL | 0 refills | Status: DC
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
## 2016-04-20 NOTE — Progress Notes (Signed)
Pre visit review using our clinic review tool, if applicable. No additional management support is needed unless otherwise documented below in the visit note. 

## 2016-04-20 NOTE — Patient Instructions (Signed)
It was great meeting you today!  Take the steroid dose pack as prescribed, be sure to monitor your blood sugars at least twice a day. Call us if your blood sugar goes above 300.  You can use the muscle relaxer (flexeril) to help with your symptoms, take as prescribed, however you should only take this while at home in case it makes you sleepy.  Please follow-up with Dr. Etter Sjogren for your headaches. If you develop dizziness, severe headache that you could describe as the the worst headache of your life, weakness, slurred speech, confusion, or other worrisome symptoms, please go to the emergency room!

## 2016-04-21 ENCOUNTER — Telehealth: Payer: Self-pay | Admitting: Family Medicine

## 2016-04-21 NOTE — Telephone Encounter (Signed)
Christine Walker-- directions on Rx just say as directed. How should pt take it?

## 2016-04-21 NOTE — Telephone Encounter (Signed)
Notified pt and she voices understanding. 

## 2016-04-21 NOTE — Telephone Encounter (Signed)
Day 1: Two tablets before breakfast, one after lunch, one after dinner, and two at bedtime. If started late in the day, take all six tablets at once or divide into two or three doses, unless otherwise directed by prescriber. Day 2: One tablet before breakfast, one after lunch, one after dinner, and two at bedtime Day 3: One tablet before breakfast, one after lunch, one after dinner, and one at bedtime Day 4: One tablet before breakfast, one after lunch, and one at bedtime Day 5: One tablet before breakfast and one at bedtime Day 6: One tablet before breakfast

## 2016-04-21 NOTE — Telephone Encounter (Signed)
°  Relation to [id number] Call back number: [phone] Pharmacy:  Reason for call:  Patient in need of clarification regarding direction for methylPREDNISolone (MEDROL DOSEPAK) 4 MG TBPK tablet, please advise

## 2016-04-22 ENCOUNTER — Encounter (HOSPITAL_COMMUNITY): Payer: BC Managed Care – PPO

## 2016-04-24 ENCOUNTER — Encounter (HOSPITAL_COMMUNITY): Payer: BC Managed Care – PPO

## 2016-04-27 ENCOUNTER — Encounter (HOSPITAL_COMMUNITY): Payer: BC Managed Care – PPO

## 2016-04-29 ENCOUNTER — Encounter (HOSPITAL_COMMUNITY): Payer: BC Managed Care – PPO

## 2016-05-04 ENCOUNTER — Encounter (HOSPITAL_COMMUNITY)
Admission: RE | Admit: 2016-05-04 | Discharge: 2016-05-04 | Disposition: A | Discharge status: Home / Self Care | Payer: BC Managed Care – PPO | Source: Ambulatory Visit | Attending: Cardiology | Admitting: Cardiology

## 2016-05-04 VITALS — Ht 67.5 in | Wt 159.0 lb

## 2016-05-04 DIAGNOSIS — Z955 Presence of coronary angioplasty implant and graft: Secondary | ICD-10-CM

## 2016-05-04 DIAGNOSIS — Z95828 Presence of other vascular implants and grafts: Secondary | ICD-10-CM | POA: Diagnosis not present

## 2016-05-04 NOTE — Progress Notes (Signed)
Discharge Summary  Patient Details  Name: Christine Walker MRN: [id number] Date of Birth: 08-Jun-1950 Referring Provider:   Flowsheet Row CARDIAC REHAB PHASE II ORIENTATION from 12/26/2015 in [facility]  Referring Provider  Fransico Him MD       Number of Visits: 11 Reason for Discharge:  Patient reached a stable level of exercise. Patient independent in their exercise.  Smoking History:  History  Smoking Status  . Never Smoker  Smokeless Tobacco  . Never Used    Diagnosis:  09/04/15 S/P coronary artery stent placement  ADL UCSD:   Initial Exercise Prescription:     Initial Exercise Prescription - 12/26/15 1200      Date of Initial Exercise RX and Referring Provider   Date 12/26/15   Referring Provider Fransico Him MD     Treadmill   MPH 2.4   Grade 1   Minutes 10   METs 3.17     Bike   Level 0.8   Minutes 10   METs 3.16     NuStep   Level 3   Minutes 10   METs 2     Prescription Details   Frequency (times per week) 3   Duration Progress to 30 minutes of continuous aerobic without signs/symptoms of physical distress     Intensity   THRR 40-80% of Max Heartrate 63-126   Ratings of Perceived Exertion 11-13   Perceived Dyspnea 0-4     Progression   Progression Continue to progress workloads to maintain intensity without signs/symptoms of physical distress.     Resistance Training   Training Prescription Yes   Weight 3lbs   Reps 10-12      Discharge Exercise Prescription (Final Exercise Prescription Changes):     Exercise Prescription Changes - 05/11/16 1500      Exercise Review   Progression Yes     Response to Exercise   Blood Pressure (Admit) 104/62   Blood Pressure (Exercise) 124/70   Blood Pressure (Exit) 108/64   Heart Rate (Admit) 85 bpm   Heart Rate (Exercise) 116 bpm   Heart Rate (Exit) 71 bpm   Rating of Perceived Exertion (Exercise) 11   Symptoms none   Comments Home exercise guidelines  reviewed on  01/06/16.   Duration Progress to 30 minutes of continuous aerobic without signs/symptoms of physical distress   Intensity THRR unchanged     Progression   Progression Continue to progress workloads to maintain intensity without signs/symptoms of physical distress.   Average METs 4.4     Resistance Training   Training Prescription Yes   Weight 4lbs   Reps 10-12     Interval Training   Interval Training No     Treadmill   MPH 2.6   Grade 3   Minutes 10   METs 4.07     Bike   Level 1.5   Minutes 10   METs 4.9     NuStep   Level 5   Minutes 10   METs 4.1     Home Exercise Plan   Plans to continue exercise at Home   Frequency Add 4 additional days to program exercise sessions.      Functional Capacity:     6 Minute Walk    Row Name 12/26/15 1214 05/11/16 1110 05/11/16 1543     6 Minute Walk   Phase Initial (P)  Discharge Discharge   Distance 1626 feet (P)  1701 feet 1701 feet   Distance %  Change  - (P)  4.61 % 4.61 %   Walk Time 6 minutes (P)  6 minutes 6 minutes   # of Rest Breaks 0 (P)  0 0   MPH 3.1  - 3.22   METS 4.1  - 4.25   RPE 11 (P)  11 11   VO2 Peak 14.3  - 14.9   Symptoms No  - No   Resting HR 68 bpm  - 85 bpm   Resting BP 120/70  - 104/62   Max Ex. HR 97 bpm  - 116 bpm   Max Ex. BP 140/80  - 124/70   2 Minute Post BP 108/60  - 108/64      Psychological, QOL, Others - Outcomes: PHQ 2/9: Depression screen [facility] 2/9 05/04/2016 01/01/2016 04/28/2012  Decreased Interest 0 0 0  Down, Depressed, Hopeless 0 0 0  PHQ - 2 Score 0 0 0    Quality of Life:     Quality of Life - 05/11/16 1545      Quality of Life Scores   Health/Function Pre 26 %   Health/Function Post 26.1 %   Health/Function % Change 0.38 %   Socioeconomic Pre 20 %   Socioeconomic Post 25.5 %   Socioeconomic % Change  27.5 %   Psych/Spiritual Pre 28.29 %   Psych/Spiritual Post 27.43 %   Psych/Spiritual % Change -3.04 %   Family Pre 21.6 %   Family Post 26.4 %    Family % Change 22.22 %   GLOBAL Pre 24.59 %   GLOBAL Post 26.32 %   GLOBAL % Change 7.04 %      Personal Goals: Goals established at orientation with interventions provided to work toward goal.     Personal Goals and Risk Factors at Admission - 12/26/15 0856      Core Components/Risk Factors/Patient Goals on Admission   Increase Strength and Stamina Yes   Intervention Provide advice, education, support and counseling about physical activity/exercise needs.;Develop an individualized exercise prescription for aerobic and resistive training based on initial evaluation findings, risk stratification, comorbidities and participant's personal goals.   Expected Outcomes Achievement of increased cardiorespiratory fitness and enhanced flexibility, muscular endurance and strength shown through measurements of functional capacity and personal statement of participant.   Hypertension Yes   Intervention Monitor prescription use compliance.;Provide education on lifestyle modifcations including regular physical activity/exercise, weight management, moderate sodium restriction and increased consumption of fresh fruit, vegetables, and low fat dairy, alcohol moderation, and smoking cessation.   Expected Outcomes Short Term: Continued assessment and intervention until BP is < 140/4m HG in hypertensive participants. < 130/851mHG in hypertensive participants with diabetes, heart failure or chronic kidney disease.;Long Term: Maintenance of blood pressure at goal levels.   Lipids Yes   Intervention Provide education and support for participant on nutrition & aerobic/resistive exercise along with prescribed medications to achieve LDL <7022mHDL >1m54m Expected Outcomes Short Term: Participant states understanding of desired cholesterol values and is compliant with medications prescribed. Participant is following exercise prescription and nutrition guidelines.;Long Term: Cholesterol controlled with medications as  prescribed, with individualized exercise RX and with personalized nutrition plan. Value goals: LDL < 70mg53mL > 40 mg.   Personal Goal Other Yes   Personal Goal short: improve energy and stamina with repeated activities;  long: completely healed, total restoration   Intervention Provide exercise programming to improve cardiovacular fitness levels. and nutrition education to assist to diabetes and HH diet   Expected  Outcomes Pt will have better understanding of diet for heart health and be able to improve energy levels       Personal Goals Discharge:     Goals and Risk Factor Review    Row Name 01/21/16 0940 03/13/16 1045 04/14/16 1137         Core Components/Risk Factors/Patient Goals Review   Personal Goals Review Other Other Increase Strength and Stamina;Other     Review Participant is off to a good start with exercise. Walking daily and increasing workloads at cardiac rehab steadily. Pt c/o of fatigue and being tired. Pt has been sick with a cold and is recovering,  Pt also has a cough that keeps her from sleeping well at night. Pt reported feeling better and having improved strength/stamina. Pt is stretching everyday and walks 5-10,000 steps per day.     Expected Outcomes Maintain regular exercise routine to help improve cardiorespiratory fitness. Pt will start to feel better and get back to exercise routine. Pt will continue with exercise and activity levels to improve aerobic fitness and exercise tolerance        Nutrition & Weight - Outcomes:     Pre Biometrics - 12/26/15 1219      Pre Biometrics   Waist Circumference 31.5 inches   Hip Circumference 42 inches   Waist to Hip Ratio 0.75 %   Triceps Skinfold 22 mm   % Body Fat 33.8 %   Grip Strength 38 kg   Flexibility 16 in         Post Biometrics - 05/11/16 1541       Post  Biometrics   Height 5' 7.5" (1.715 m)   Weight 158 lb 15.2 oz (72.1 kg)   Waist Circumference 31.25 inches   Hip Circumference 41.75 inches    Waist to Hip Ratio 0.75 %   BMI (Calculated) 24.6   Triceps Skinfold 19 mm   % Body Fat 33.1 %   Grip Strength 40.5 kg   Flexibility 18 in   Single Leg Stand 30 seconds      Nutrition:     Nutrition Therapy & Goals - 12/30/15 0825      Nutrition Therapy   Diet Carb Modified, Therapeutic Lifestyle Changes     Personal Nutrition Goals   Personal Goal #1 Pt to maintain her wt around 156 lb while in [place], educate and counsel regarding individualized specific dietary modifications aiming towards targeted core components such as weight, hypertension, lipid management, diabetes, heart failure and other comorbidities.   Expected Outcomes Short Term Goal: Understand basic principles of dietary content, such as calories, fat, sodium, cholesterol and nutrients.;Long Term Goal: Adherence to prescribed nutrition plan.      Nutrition Discharge:     Nutrition Assessments - 05/12/16 1404      MEDFICTS Scores   Pre Score 42   Post Score 18   Score Difference -24      Education Questionnaire Score:     Knowledge Questionnaire Score - 05/11/16 1537      Knowledge Questionnaire Score   Post Score 22/24      Goals reviewed with patient. Pt graduated from cardiac rehab program today with completion of 31 exercise sessions in Phase II. Pt maintained fair  attendance and progressed nicely during his participation in rehab as evidenced by increased MET level.   Medication list reconciled. Repeat  PHQ score-0.  Pt completed post assessment quality of  life survey.  Pt scored the following     Quality of Life - 05/11/16 1545      Quality of Life Scores   Health/Function Pre 26 %   Health/Function Post 26.1 %   Health/Function % Change 0.38 %   Socioeconomic Pre 20 %   Socioeconomic Post 25.5 %   Socioeconomic % Change  27.5 %   Psych/Spiritual Pre 28.29 %   Psych/Spiritual Post 27.43 %   Psych/Spiritual % Change -3.04 %    Family Pre 21.6 %   Family Post 26.4 %   Family % Change 22.22 %   GLOBAL Pre 24.59 %   GLOBAL Post 26.32 %   GLOBAL % Change 7.04 %     Pt demonstrated increase in all areas and generally feels hopeful about her future.  Pt with supportive family and positive healthy coping skills. Pt has made significant lifestyle changes and should be commended for his success. Pt feels she has achieved her goals during cardiac rehab.  Pt has improved stamina and energy. Pt feels she is healed and has total restoration. Pt plans to continue exercise at home with walking four to five days a week. Cherre Huger, BSN

## 2016-05-11 ENCOUNTER — Other Ambulatory Visit: Payer: Self-pay | Admitting: Family Medicine

## 2016-05-11 DIAGNOSIS — E1165 Type 2 diabetes mellitus with hyperglycemia: Principal | ICD-10-CM

## 2016-05-11 DIAGNOSIS — IMO0001 Reserved for inherently not codable concepts without codable children: Secondary | ICD-10-CM

## 2016-05-12 ENCOUNTER — Other Ambulatory Visit: Payer: Self-pay | Admitting: Family Medicine

## 2016-05-21 ENCOUNTER — Encounter (HOSPITAL_COMMUNITY): Payer: Self-pay | Admitting: *Deleted

## 2016-05-21 DIAGNOSIS — N63 Unspecified lump in unspecified breast: Secondary | ICD-10-CM

## 2016-05-21 HISTORY — DX: Unspecified lump in unspecified breast: N63.0

## 2016-05-28 ENCOUNTER — Ambulatory Visit: Payer: BC Managed Care – PPO | Admitting: Neurology

## 2016-06-11 ENCOUNTER — Other Ambulatory Visit: Payer: Self-pay | Admitting: Obstetrics and Gynecology

## 2016-06-11 DIAGNOSIS — Z1231 Encounter for screening mammogram for malignant neoplasm of breast: Secondary | ICD-10-CM

## 2016-06-16 ENCOUNTER — Other Ambulatory Visit: Payer: Self-pay | Admitting: Obstetrics and Gynecology

## 2016-06-16 DIAGNOSIS — N63 Unspecified lump in unspecified breast: Secondary | ICD-10-CM

## 2016-06-18 ENCOUNTER — Ambulatory Visit
Admission: RE | Admit: 2016-06-18 | Discharge: 2016-06-18 | Disposition: A | Discharge status: Home / Self Care | Payer: BC Managed Care – PPO | Source: Ambulatory Visit | Attending: Obstetrics and Gynecology | Admitting: Obstetrics and Gynecology

## 2016-06-18 DIAGNOSIS — N63 Unspecified lump in unspecified breast: Secondary | ICD-10-CM

## 2016-06-18 HISTORY — DX: Unspecified lump in unspecified breast: N63.0

## 2016-08-10 ENCOUNTER — Other Ambulatory Visit: Payer: Self-pay | Admitting: Cardiology

## 2016-08-17 ENCOUNTER — Telehealth: Payer: Self-pay | Admitting: *Deleted

## 2016-08-17 NOTE — Telephone Encounter (Signed)
Received a letter from ins that patient needs to schedule a visit to follow up with diabetes to get A1C and to also schedule a visit with eye doctor.   Left message on machine for patient to call and make appt here and with an eye doctor.

## 2016-08-18 ENCOUNTER — Encounter: Payer: Self-pay | Admitting: Family Medicine

## 2016-08-18 ENCOUNTER — Ambulatory Visit (INDEPENDENT_AMBULATORY_CARE_PROVIDER_SITE_OTHER): Payer: BC Managed Care – PPO | Admitting: Family Medicine

## 2016-08-18 VITALS — BP 116/68 | HR 77 | Temp 98.0°F | Resp 16 | Ht 68.0 in | Wt 158.2 lb

## 2016-08-18 DIAGNOSIS — E1165 Type 2 diabetes mellitus with hyperglycemia: Secondary | ICD-10-CM | POA: Diagnosis not present

## 2016-08-18 DIAGNOSIS — E785 Hyperlipidemia, unspecified: Secondary | ICD-10-CM | POA: Diagnosis not present

## 2016-08-18 DIAGNOSIS — E1151 Type 2 diabetes mellitus with diabetic peripheral angiopathy without gangrene: Secondary | ICD-10-CM | POA: Diagnosis not present

## 2016-08-18 DIAGNOSIS — W57XXXA Bitten or stung by nonvenomous insect and other nonvenomous arthropods, initial encounter: Secondary | ICD-10-CM

## 2016-08-18 DIAGNOSIS — M25542 Pain in joints of left hand: Secondary | ICD-10-CM

## 2016-08-18 DIAGNOSIS — M25541 Pain in joints of right hand: Secondary | ICD-10-CM | POA: Diagnosis not present

## 2016-08-18 DIAGNOSIS — I10 Essential (primary) hypertension: Secondary | ICD-10-CM

## 2016-08-18 DIAGNOSIS — S40861A Insect bite (nonvenomous) of right upper arm, initial encounter: Secondary | ICD-10-CM

## 2016-08-18 DIAGNOSIS — E118 Type 2 diabetes mellitus with unspecified complications: Secondary | ICD-10-CM

## 2016-08-18 DIAGNOSIS — IMO0002 Reserved for concepts with insufficient information to code with codable children: Secondary | ICD-10-CM

## 2016-08-18 LAB — LIPID PANEL
CHOL/HDL RATIO: 2
Cholesterol: 161 mg/dL (ref 0–200)
HDL: 66.6 mg/dL (ref >39.00)
LDL CALC: 77 mg/dL (ref 0–99)
NonHDL: 94.76
TRIGLYCERIDES: 87 mg/dL (ref 0.0–149.0)
VLDL: 17.4 mg/dL (ref 0.0–40.0)

## 2016-08-18 LAB — COMPREHENSIVE METABOLIC PANEL
ALT: 13 U/L (ref 0–35)
AST: 17 U/L (ref 0–37)
Albumin: 4.3 g/dL (ref 3.5–5.2)
Alkaline Phosphatase: 60 U/L (ref 39–117)
BUN: 19 mg/dL (ref 6–23)
CALCIUM: 9.8 mg/dL (ref 8.4–10.5)
CHLORIDE: 105 meq/L (ref 96–112)
CO2: 31 meq/L (ref 19–32)
CREATININE: 0.83 mg/dL (ref 0.40–1.20)
GFR: 89.03 mL/min (ref >60.00)
Glucose, Bld: 143 mg/dL — ABNORMAL HIGH (ref 70–99)
Potassium: 4.4 mEq/L (ref 3.5–5.1)
SODIUM: 142 meq/L (ref 135–145)
Total Bilirubin: 0.6 mg/dL (ref 0.2–1.2)
Total Protein: 6.9 g/dL (ref 6.0–8.3)

## 2016-08-18 LAB — MICROALBUMIN / CREATININE URINE RATIO
CREATININE, U: 381.1 mg/dL
Microalb Creat Ratio: 0.4 mg/g (ref 0.0–30.0)
Microalb, Ur: 1.5 mg/dL (ref 0.0–1.9)

## 2016-08-18 LAB — HEMOGLOBIN A1C: HEMOGLOBIN A1C: 7.5 % — AB (ref 4.6–6.5)

## 2016-08-18 LAB — TSH: TSH: 0.81 u[IU]/mL (ref 0.35–4.50)

## 2016-08-18 MED ORDER — TRIAMCINOLONE ACETONIDE 0.1 % EX CREA
1.0000 | TOPICAL_CREAM | Freq: Two times a day (BID) | CUTANEOUS | 0 refills | Status: DC
Start: 2016-08-18 — End: 2017-11-23

## 2016-08-18 NOTE — Assessment & Plan Note (Signed)
Tolerating statin, encouraged heart healthy diet, avoid trans fats, minimize simple carbs and saturated fats. Increase exercise as tolerated 

## 2016-08-18 NOTE — Progress Notes (Signed)
Pre visit review using our clinic review tool, if applicable. No additional management support is needed unless otherwise documented below in the visit note. 

## 2016-08-18 NOTE — Assessment & Plan Note (Signed)
Well controlled, no changes to meds. Encouraged heart healthy diet such as the DASH diet and exercise as tolerated.  °

## 2016-08-18 NOTE — Patient Instructions (Signed)
Insect Bite, Adult An insect bite can make your skin red, itchy, and swollen. Some insects can spread disease to people with a bite. However, most insect bites do not lead to disease, and most are not serious. Follow these instructions at home: Bite area care   Do not scratch the bite area.  Keep the bite area clean and dry.  Wash the bite area every day with soap and water as told by your doctor.  Check the bite area every day for signs of infection. Check for:  More redness, swelling, or pain.  Fluid or blood.  Warmth.  Pus. Managing pain, itching, and swelling   You may put any of these on the bite area as told by your doctor:  A baking soda paste.  Cortisone cream.  Calamine lotion.  If directed, put ice on the bite area.  Put ice in a plastic bag.  Place a towel between your skin and the bag.  Leave the ice on for 20 minutes, 2-3 times a day. Medicines   Take medicines or put medicines on your skin only as told by your doctor.  If you were prescribed an antibiotic medicine, use it as told by your doctor. Do not stop using the antibiotic even if your condition improves. General instructions   Keep all follow-up visits as told by your doctor. This is important. How is this prevented? To help you have a lower risk of insect bites:  When you are outside, wear clothing that covers your arms and legs.  Use insect repellent. The best insect repellents have:  An active ingredient of DEET, picaridin, oil of lemon eucalyptus (OLE), or IR3535.  Higher amounts of DEET or another active ingredient than other repellents have.  If your home windows do not have screens, think about putting some in. Contact a doctor if:  You have more redness, swelling, or pain in the bite area.  You have fluid, blood, or pus coming from the bite area.  The bite area feels warm.  You have a fever. Get help right away if:  You have joint pain.  You have a rash.  You have  shortness of breath.  You feel more tired or sleepy than you normally do.  You have neck pain.  You have a headache.  You feel weaker than you normally do.  You have chest pain.  You have pain in your belly.  You feel sick to your stomach (nauseous) or you throw up (vomit). Summary  An insect bite can make your skin red, itchy, and swollen.  Do not scratch the bite area, and keep it clean and dry.  Ice can help with pain and itching from the bite. This information is not intended to replace advice given to you by your health care provider. Make sure you discuss any questions you have with your health care provider. Document Released: 03/27/2000 Document Revised: 10/31/2015 Document Reviewed: 08/15/2014 Elsevier Interactive Patient Education  2017 [employer] Inc.  
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
## 2016-08-18 NOTE — Assessment & Plan Note (Signed)
hgba1c acceptable, minimize simple carbs. Increase exercise as tolerated. Continue current meds 

## 2016-08-19 LAB — ANA: Anti Nuclear Antibody(ANA): NEGATIVE

## 2016-08-19 LAB — RHEUMATOID FACTOR: Rhuematoid fact SerPl-aCnc: <14 IU/mL (ref <14)

## 2016-08-20 ENCOUNTER — Other Ambulatory Visit: Payer: Self-pay | Admitting: Family Medicine

## 2016-08-20 DIAGNOSIS — E785 Hyperlipidemia, unspecified: Secondary | ICD-10-CM

## 2016-08-20 DIAGNOSIS — E119 Type 2 diabetes mellitus without complications: Secondary | ICD-10-CM

## 2016-08-20 MED ORDER — METFORMIN HCL 500 MG PO TABS: 500.0000 mg | ORAL_TABLET | Freq: Two times a day (BID) | ORAL | 3 refills | Status: DC

## 2016-09-22 ENCOUNTER — Other Ambulatory Visit: Payer: Self-pay | Admitting: Family Medicine

## 2016-09-22 DIAGNOSIS — E1165 Type 2 diabetes mellitus with hyperglycemia: Principal | ICD-10-CM

## 2016-09-22 DIAGNOSIS — IMO0001 Reserved for inherently not codable concepts without codable children: Secondary | ICD-10-CM

## 2016-09-22 MED ORDER — GLUCOSE BLOOD VI STRP: ORAL_STRIP | 1 refills | Status: DC

## 2016-09-23 ENCOUNTER — Other Ambulatory Visit: Payer: Self-pay | Admitting: Family Medicine

## 2016-10-01 ENCOUNTER — Telehealth: Payer: Self-pay | Admitting: Cardiology

## 2016-10-01 NOTE — Telephone Encounter (Signed)
Patient called to report that she researched Plavix and is concerned about the side effects and would like to change medications or stop the Plavix if possible. Confirmed with patient she has experienced no side effects, and reiterated to her that side effects are only side effects if they present themselves. She will continue taking for now but does request Dr. Radford Pax to look at her chart and see if she can switch/stop. She was grateful for call.

## 2016-10-01 NOTE — Telephone Encounter (Signed)
Since it has been a year since her PCI she can stop Plavix and stay on ASA

## 2016-10-01 NOTE — Telephone Encounter (Signed)
New message    Pt is calling.   Pt c/o medication issue:  1. Name of Medication: clopidogrel 75 mg  2. How are you currently taking this medication (dosage and times per day)? Once daily  3. Are you having a reaction (difficulty breathing--STAT)? no  4. What is your medication issue? Pt states that she looked up the side effects of this medication and she is not impressed. She wants to talk about other options.

## 2016-10-02 NOTE — Telephone Encounter (Signed)
Left a message for the Christine Walker to call back, to endorse Dr Theodosia Blender recommendations.

## 2016-10-05 NOTE — Telephone Encounter (Signed)
Spoke with pt and made her aware Dr. Radford Pax said ok to stop Plavix since it has been a year since PCI, continue ASA.  Pt verbalized understanding and was in agreement with this plan.

## 2016-10-05 NOTE — Telephone Encounter (Signed)
Left message for patient to call back  

## 2016-10-05 NOTE — Telephone Encounter (Signed)
New message  ° ° ° ° °Pt is returning your call  °

## 2016-11-10 ENCOUNTER — Other Ambulatory Visit: Payer: Self-pay | Admitting: Family Medicine

## 2016-11-20 ENCOUNTER — Other Ambulatory Visit: Payer: BC Managed Care – PPO

## 2016-12-02 ENCOUNTER — Ambulatory Visit: Payer: BC Managed Care – PPO

## 2016-12-07 ENCOUNTER — Ambulatory Visit: Payer: BC Managed Care – PPO

## 2016-12-08 ENCOUNTER — Ambulatory Visit
Admission: RE | Admit: 2016-12-08 | Discharge: 2016-12-08 | Disposition: A | Discharge status: Home / Self Care | Payer: BC Managed Care – PPO | Source: Ambulatory Visit | Attending: Obstetrics and Gynecology | Admitting: Obstetrics and Gynecology

## 2016-12-08 DIAGNOSIS — Z1231 Encounter for screening mammogram for malignant neoplasm of breast: Secondary | ICD-10-CM

## 2016-12-29 ENCOUNTER — Telehealth: Payer: Self-pay | Admitting: Cardiology

## 2016-12-29 NOTE — Telephone Encounter (Signed)
I'm fine with that 

## 2016-12-29 NOTE — Telephone Encounter (Signed)
Pt would like to switch from Dr. Radford Pax to Dr. Tamala Julian.  Will route to both MDs for approval.

## 2016-12-30 NOTE — Telephone Encounter (Signed)
Will send message to scheduling dept to get pt scheduled for new pt appt with Dr. Tamala Julian.

## 2016-12-30 NOTE — Telephone Encounter (Signed)
OK 
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
## 2017-01-11 ENCOUNTER — Encounter (INDEPENDENT_AMBULATORY_CARE_PROVIDER_SITE_OTHER): Payer: Self-pay

## 2017-01-11 ENCOUNTER — Encounter: Payer: Self-pay | Admitting: Interventional Cardiology

## 2017-01-11 ENCOUNTER — Ambulatory Visit (INDEPENDENT_AMBULATORY_CARE_PROVIDER_SITE_OTHER): Payer: BC Managed Care – PPO | Admitting: Interventional Cardiology

## 2017-01-11 VITALS — BP 144/86 | HR 56 | Ht 67.0 in | Wt 158.4 lb

## 2017-01-11 DIAGNOSIS — IMO0002 Reserved for concepts with insufficient information to code with codable children: Secondary | ICD-10-CM

## 2017-01-11 DIAGNOSIS — E1165 Type 2 diabetes mellitus with hyperglycemia: Secondary | ICD-10-CM

## 2017-01-11 DIAGNOSIS — E118 Type 2 diabetes mellitus with unspecified complications: Secondary | ICD-10-CM | POA: Diagnosis not present

## 2017-01-11 DIAGNOSIS — I2510 Atherosclerotic heart disease of native coronary artery without angina pectoris: Secondary | ICD-10-CM

## 2017-01-11 DIAGNOSIS — I4510 Unspecified right bundle-branch block: Secondary | ICD-10-CM

## 2017-01-11 DIAGNOSIS — E785 Hyperlipidemia, unspecified: Secondary | ICD-10-CM | POA: Diagnosis not present

## 2017-01-11 MED ORDER — LISINOPRIL 5 MG PO TABS: 5.0000 mg | ORAL_TABLET | Freq: Every day | ORAL | 3 refills | Status: DC

## 2017-01-11 NOTE — Patient Instructions (Signed)
Medication Instructions:  1) START Lisinopril 5mg  once daily 2) Start the Metformin as instructed from previous physician  Labwork: Your physician recommends that you return for lab work in: 1 month (BMET)   Testing/Procedures: Your physician has requested that you have en exercise stress myoview. For further information please visit [email or web address]. Please follow instruction sheet, as given.   Follow-Up: Your physician wants you to follow-up in: 6 months with Dr. Tamala Julian.  You will receive a reminder letter in the mail two months in advance. If you don't receive a letter, please call our office to schedule the follow-up appointment.   Any Other Special Instructions Will Be Listed Below (If Applicable).     If you need a refill on your cardiac medications before your next appointment, please call your pharmacy.

## 2017-02-16 ENCOUNTER — Telehealth (HOSPITAL_COMMUNITY): Payer: Self-pay | Admitting: *Deleted

## 2017-02-16 NOTE — Telephone Encounter (Signed)
Patient given detailed instructions per Myocardial Perfusion Study Information Sheet for the test on 02/22/17. Patient notified to arrive 15 minutes early and that it is imperative to arrive on time for appointment to keep from having the test rescheduled.  If you need to cancel or reschedule your appointment, please call the office within 24 hours of your appointment. . Patient verbalized understanding. Kirstie Peri

## 2017-02-20 ENCOUNTER — Other Ambulatory Visit: Payer: Self-pay | Admitting: Family Medicine

## 2017-02-20 DIAGNOSIS — IMO0001 Reserved for inherently not codable concepts without codable children: Secondary | ICD-10-CM

## 2017-02-20 DIAGNOSIS — E1165 Type 2 diabetes mellitus with hyperglycemia: Principal | ICD-10-CM

## 2017-02-22 ENCOUNTER — Ambulatory Visit (HOSPITAL_COMMUNITY): Payer: BC Managed Care – PPO | Attending: Cardiology

## 2017-02-22 ENCOUNTER — Other Ambulatory Visit: Payer: BC Managed Care – PPO | Admitting: *Deleted

## 2017-02-22 DIAGNOSIS — E785 Hyperlipidemia, unspecified: Secondary | ICD-10-CM | POA: Insufficient documentation

## 2017-02-22 DIAGNOSIS — I2510 Atherosclerotic heart disease of native coronary artery without angina pectoris: Secondary | ICD-10-CM | POA: Diagnosis present

## 2017-02-22 DIAGNOSIS — I10 Essential (primary) hypertension: Secondary | ICD-10-CM | POA: Insufficient documentation

## 2017-02-22 LAB — MYOCARDIAL PERFUSION IMAGING
CHL CUP NUCLEAR SDS: 1
CHL CUP RESTING HR STRESS: 64 {beats}/min
CSEPED: 9 min
CSEPEDS: 0 s
CSEPHR: 96 %
Estimated workload: 10.1 METS
LV sys vol: 15 mL
LVDIAVOL: 66 mL (ref 46–106)
MPHR: 156 {beats}/min
Peak HR: 150 {beats}/min
RATE: 0.25
SRS: 5
SSS: 6
TID: 1.1

## 2017-02-22 LAB — BASIC METABOLIC PANEL
BUN / CREAT RATIO: 12 (ref 12–28)
BUN: 11 mg/dL (ref 8–27)
CO2: 26 mmol/L (ref 20–29)
CREATININE: 0.89 mg/dL (ref 0.57–1.00)
Calcium: 9.8 mg/dL (ref 8.7–10.3)
Chloride: 100 mmol/L (ref 96–106)
GFR, EST AFRICAN AMERICAN: 79 mL/min/{1.73_m2} (ref >59)
GFR, EST NON AFRICAN AMERICAN: 69 mL/min/{1.73_m2} (ref >59)
Glucose: 95 mg/dL (ref 65–99)
Potassium: 4.4 mmol/L (ref 3.5–5.2)
SODIUM: 143 mmol/L (ref 134–144)

## 2017-02-22 MED ORDER — TECHNETIUM TC 99M TETROFOSMIN IV KIT
7.5000 | PACK | Freq: Once | INTRAVENOUS | Status: AC | PRN
  Administered 2017-02-22: 7.5 via INTRAVENOUS
  Filled 2017-02-22: qty 8

## 2017-02-22 MED ORDER — TECHNETIUM TC 99M TETROFOSMIN IV KIT
27.2000 | PACK | Freq: Once | INTRAVENOUS | Status: AC | PRN
  Administered 2017-02-22: 27.2 via INTRAVENOUS
  Filled 2017-02-22: qty 28

## 2017-03-01 ENCOUNTER — Other Ambulatory Visit: Payer: BC Managed Care – PPO

## 2017-03-01 ENCOUNTER — Encounter (HOSPITAL_COMMUNITY): Payer: BC Managed Care – PPO

## 2017-03-02 ENCOUNTER — Telehealth: Payer: Self-pay | Admitting: Interventional Cardiology

## 2017-03-02 NOTE — Telephone Encounter (Signed)
Spoke with pt and went over lab and stress test results. Pt verbalized understanding and was appreciative for call.

## 2017-03-02 NOTE — Telephone Encounter (Signed)
New Message     Patient returning call for lab and stress test results

## 2017-03-25 ENCOUNTER — Telehealth: Payer: Self-pay

## 2017-03-25 NOTE — Telephone Encounter (Signed)
Copied from [facility] [phone]. Topic: General - Other >> Mar 25, 2017 11:24 AM Carolyn Stare wrote:   Pt call to say she has she had been reading up on METFORMIN. She said it does more harm than good and is asking if something else can be called in.      Pharmacy [facility]

## 2017-03-29 NOTE — Telephone Encounter (Signed)
januvia 100 mg 1 po qd #30  2 refills

## 2017-03-30 MED ORDER — SITAGLIPTIN PHOSPHATE 100 MG PO TABS: 100.0000 mg | ORAL_TABLET | Freq: Every day | ORAL | 2 refills | Status: DC

## 2017-03-30 NOTE — Telephone Encounter (Signed)
Pt called in to follow up, advised pt that office called and was unable to lvm. Pt is requesting a call back, she will be waiting. Advised pt of the message below.    Please assist further

## 2017-03-30 NOTE — Telephone Encounter (Signed)
Mailbox is full and cannot except messages.

## 2017-03-30 NOTE — Telephone Encounter (Signed)
Notified pt and she is agreeable to start Januvia. Rx sent. Pt also states her glucometer face is broken on her machine. Placed Onetouch Verio meter at front desk for her to pick up at her convenience.

## 2017-04-03 ENCOUNTER — Other Ambulatory Visit: Payer: Self-pay | Admitting: Cardiology

## 2017-04-03 DIAGNOSIS — I2510 Atherosclerotic heart disease of native coronary artery without angina pectoris: Secondary | ICD-10-CM

## 2017-04-03 DIAGNOSIS — E785 Hyperlipidemia, unspecified: Secondary | ICD-10-CM

## 2017-04-03 DIAGNOSIS — Z8679 Personal history of other diseases of the circulatory system: Secondary | ICD-10-CM

## 2017-04-03 DIAGNOSIS — I10 Essential (primary) hypertension: Secondary | ICD-10-CM

## 2017-04-26 ENCOUNTER — Other Ambulatory Visit: Payer: Self-pay | Admitting: Cardiology

## 2017-04-26 DIAGNOSIS — I10 Essential (primary) hypertension: Secondary | ICD-10-CM

## 2017-05-04 ENCOUNTER — Encounter: Payer: Self-pay | Admitting: Family Medicine

## 2017-05-04 ENCOUNTER — Ambulatory Visit: Payer: BC Managed Care – PPO | Admitting: Family Medicine

## 2017-05-04 ENCOUNTER — Ambulatory Visit (INDEPENDENT_AMBULATORY_CARE_PROVIDER_SITE_OTHER): Payer: BC Managed Care – PPO | Admitting: Family Medicine

## 2017-05-04 VITALS — BP 98/62 | HR 56 | Temp 98.1°F | Resp 18 | Wt 157.4 lb

## 2017-05-04 DIAGNOSIS — M5441 Lumbago with sciatica, right side: Secondary | ICD-10-CM | POA: Diagnosis not present

## 2017-05-04 DIAGNOSIS — G8929 Other chronic pain: Secondary | ICD-10-CM

## 2017-05-04 DIAGNOSIS — E1151 Type 2 diabetes mellitus with diabetic peripheral angiopathy without gangrene: Secondary | ICD-10-CM | POA: Diagnosis not present

## 2017-05-04 LAB — HEMOGLOBIN A1C: Hgb A1c MFr Bld: 6.7 % — ABNORMAL HIGH (ref 4.6–6.5)

## 2017-05-04 LAB — COMPREHENSIVE METABOLIC PANEL
ALT: 11 U/L (ref 0–35)
AST: 15 U/L (ref 0–37)
Albumin: 4.2 g/dL (ref 3.5–5.2)
Alkaline Phosphatase: 57 U/L (ref 39–117)
BILIRUBIN TOTAL: 0.8 mg/dL (ref 0.2–1.2)
BUN: 21 mg/dL (ref 6–23)
CALCIUM: 9.7 mg/dL (ref 8.4–10.5)
CHLORIDE: 103 meq/L (ref 96–112)
CO2: 34 meq/L — AB (ref 19–32)
CREATININE: 0.98 mg/dL (ref 0.40–1.20)
GFR: 73.33 mL/min (ref >60.00)
Glucose, Bld: 95 mg/dL (ref 70–99)
Potassium: 4.2 mEq/L (ref 3.5–5.1)
SODIUM: 140 meq/L (ref 135–145)
Total Protein: 6.8 g/dL (ref 6.0–8.3)

## 2017-05-04 LAB — LIPID PANEL
CHOLESTEROL: 123 mg/dL (ref 0–200)
HDL: 52 mg/dL (ref >39.00)
LDL CALC: 55 mg/dL (ref 0–99)
NonHDL: 71.09
TRIGLYCERIDES: 81 mg/dL (ref 0.0–149.0)
Total CHOL/HDL Ratio: 2
VLDL: 16.2 mg/dL (ref 0.0–40.0)

## 2017-05-04 MED ORDER — TRAMADOL HCL 50 MG PO TABS: 50.0000 mg | ORAL_TABLET | Freq: Three times a day (TID) | ORAL | 0 refills | Status: DC | PRN

## 2017-05-04 MED ORDER — CYCLOBENZAPRINE HCL 10 MG PO TABS: 10.0000 mg | ORAL_TABLET | Freq: Three times a day (TID) | ORAL | 0 refills | Status: DC | PRN

## 2017-05-04 MED ORDER — PREDNISONE 10 MG PO TABS: ORAL_TABLET | ORAL | 0 refills | Status: DC
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
## 2017-05-04 NOTE — Patient Instructions (Signed)

## 2017-05-05 ENCOUNTER — Telehealth: Payer: Self-pay | Admitting: Family Medicine

## 2017-05-05 DIAGNOSIS — G8929 Other chronic pain: Secondary | ICD-10-CM

## 2017-05-05 DIAGNOSIS — M5441 Lumbago with sciatica, right side: Principal | ICD-10-CM

## 2017-05-05 NOTE — Telephone Encounter (Signed)
Copied from [place]. Topic: General - Other >> May 05, 2017 11:59 AM Lolita Rieger, RMA wrote: Reason for CRM: pt would like a call in regards to her appointment yesterday [phone]

## 2017-05-07 NOTE — Telephone Encounter (Signed)
Patient requested a handicap placard.  Placard placed up front for pickup.  She will be referred to ortho for back pain.  Results of labs given to patient.

## 2017-05-11 ENCOUNTER — Telehealth: Payer: Self-pay | Admitting: *Deleted

## 2017-05-11 NOTE — Telephone Encounter (Signed)
Copied from [place]. Topic: General - Other >> May 05, 2017 11:59 AM Lolita Rieger, RMA wrote: Reason for CRM: pt would like a call in regards to her appointment yesterday [phone]

## 2017-05-11 NOTE — Telephone Encounter (Signed)
See other phone note

## 2017-05-30 ENCOUNTER — Other Ambulatory Visit: Payer: Self-pay | Admitting: Family Medicine

## 2017-06-27 ENCOUNTER — Other Ambulatory Visit: Payer: Self-pay | Admitting: Family Medicine

## 2017-07-26 ENCOUNTER — Other Ambulatory Visit: Payer: Self-pay | Admitting: Cardiology

## 2017-08-21 ENCOUNTER — Other Ambulatory Visit: Payer: Self-pay | Admitting: Family Medicine

## 2017-10-23 ENCOUNTER — Other Ambulatory Visit: Payer: Self-pay | Admitting: Family Medicine

## 2017-11-16 ENCOUNTER — Ambulatory Visit (INDEPENDENT_AMBULATORY_CARE_PROVIDER_SITE_OTHER): Payer: Medicare Other | Admitting: Family Medicine

## 2017-11-16 ENCOUNTER — Encounter: Payer: Self-pay | Admitting: Family Medicine

## 2017-11-16 VITALS — BP 131/65 | HR 65 | Temp 98.2°F | Resp 16 | Ht 67.0 in | Wt 161.2 lb

## 2017-11-16 DIAGNOSIS — I2510 Atherosclerotic heart disease of native coronary artery without angina pectoris: Secondary | ICD-10-CM

## 2017-11-16 DIAGNOSIS — I10 Essential (primary) hypertension: Secondary | ICD-10-CM

## 2017-11-16 DIAGNOSIS — M25561 Pain in right knee: Secondary | ICD-10-CM

## 2017-11-16 DIAGNOSIS — M5441 Lumbago with sciatica, right side: Secondary | ICD-10-CM | POA: Diagnosis not present

## 2017-11-16 DIAGNOSIS — E1151 Type 2 diabetes mellitus with diabetic peripheral angiopathy without gangrene: Secondary | ICD-10-CM | POA: Diagnosis not present

## 2017-11-16 DIAGNOSIS — R350 Frequency of micturition: Secondary | ICD-10-CM

## 2017-11-16 DIAGNOSIS — E11649 Type 2 diabetes mellitus with hypoglycemia without coma: Secondary | ICD-10-CM

## 2017-11-16 DIAGNOSIS — E785 Hyperlipidemia, unspecified: Secondary | ICD-10-CM

## 2017-11-16 DIAGNOSIS — G8929 Other chronic pain: Secondary | ICD-10-CM

## 2017-11-16 LAB — COMPREHENSIVE METABOLIC PANEL
ALBUMIN: 4.4 g/dL (ref 3.5–5.2)
ALK PHOS: 55 U/L (ref 39–117)
ALT: 13 U/L (ref 0–35)
AST: 18 U/L (ref 0–37)
BILIRUBIN TOTAL: 0.6 mg/dL (ref 0.2–1.2)
BUN: 13 mg/dL (ref 6–23)
CALCIUM: 10 mg/dL (ref 8.4–10.5)
CO2: 33 meq/L — AB (ref 19–32)
CREATININE: 0.87 mg/dL (ref 0.40–1.20)
Chloride: 102 mEq/L (ref 96–112)
GFR: 83.99 mL/min (ref >60.00)
Glucose, Bld: 123 mg/dL — ABNORMAL HIGH (ref 70–99)
Potassium: 4.8 mEq/L (ref 3.5–5.1)
SODIUM: 140 meq/L (ref 135–145)
TOTAL PROTEIN: 6.8 g/dL (ref 6.0–8.3)

## 2017-11-16 LAB — POC URINALSYSI DIPSTICK (AUTOMATED)
Bilirubin, UA: NEGATIVE
Blood, UA: NEGATIVE
Glucose, UA: NEGATIVE
KETONES UA: NEGATIVE
Leukocytes, UA: NEGATIVE
Nitrite, UA: NEGATIVE
PH UA: 5.5 (ref 5.0–8.0)
PROTEIN UA: NEGATIVE
UROBILINOGEN UA: 0.2 U/dL

## 2017-11-16 LAB — HEMOGLOBIN A1C: HEMOGLOBIN A1C: 6.7 % — AB (ref 4.6–6.5)

## 2017-11-16 LAB — LIPID PANEL
CHOLESTEROL: 141 mg/dL (ref 0–200)
HDL: 63 mg/dL (ref >39.00)
LDL Cholesterol: 66 mg/dL (ref 0–99)
NONHDL: 78.32
Total CHOL/HDL Ratio: 2
Triglycerides: 62 mg/dL (ref 0.0–149.0)
VLDL: 12.4 mg/dL (ref 0.0–40.0)

## 2017-11-16 MED ORDER — LISINOPRIL 5 MG PO TABS: 5.0000 mg | ORAL_TABLET | Freq: Every day | ORAL | 3 refills | Status: DC
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
## 2017-11-16 NOTE — Patient Instructions (Signed)

## 2017-11-17 ENCOUNTER — Telehealth: Payer: Self-pay | Admitting: *Deleted

## 2017-11-17 NOTE — Telephone Encounter (Signed)
Copied from [place] [phone]. Topic: General - Other >> Nov 16, 2017  1:37 PM Yvette Rack wrote: Reason for CRM: pt calling wanting a RX for headache the Tramadol isn't helping for headache please use the     [id number] - [place], [place] - [address].

## 2017-11-18 ENCOUNTER — Encounter: Payer: Self-pay | Admitting: *Deleted

## 2017-11-18 NOTE — Assessment & Plan Note (Signed)
Well controlled, no changes to meds. Encouraged heart healthy diet such as the DASH diet and exercise as tolerated.  °

## 2017-11-18 NOTE — Assessment & Plan Note (Signed)
hgba1c acceptable, minimize simple carbs. Increase exercise as tolerated. Continue current meds 

## 2017-11-18 NOTE — Telephone Encounter (Signed)
We referred patient to neuro in 2017 and she never followed up.  Advised patient to call to schedule and ov with them to let them know that tramadol is not working.

## 2017-11-18 NOTE — Assessment & Plan Note (Addendum)
Stable con't meds Per cardiology 

## 2017-11-18 NOTE — Assessment & Plan Note (Signed)
Tolerating statin, encouraged heart healthy diet, avoid trans fats, minimize simple carbs and saturated fats. Increase exercise as tolerated 

## 2017-11-23 ENCOUNTER — Ambulatory Visit (INDEPENDENT_AMBULATORY_CARE_PROVIDER_SITE_OTHER): Payer: Medicare Other | Admitting: Family Medicine

## 2017-11-23 ENCOUNTER — Encounter: Payer: Self-pay | Admitting: Family Medicine

## 2017-11-23 VITALS — BP 110/70 | HR 73 | Ht 67.0 in

## 2017-11-23 DIAGNOSIS — S40862A Insect bite (nonvenomous) of left upper arm, initial encounter: Secondary | ICD-10-CM | POA: Diagnosis not present

## 2017-11-23 DIAGNOSIS — S30861A Insect bite (nonvenomous) of abdominal wall, initial encounter: Secondary | ICD-10-CM

## 2017-11-23 DIAGNOSIS — W57XXXA Bitten or stung by nonvenomous insect and other nonvenomous arthropods, initial encounter: Secondary | ICD-10-CM | POA: Insufficient documentation

## 2017-11-23 DIAGNOSIS — S70361A Insect bite (nonvenomous), right thigh, initial encounter: Secondary | ICD-10-CM | POA: Diagnosis not present

## 2017-11-23 DIAGNOSIS — S70362A Insect bite (nonvenomous), left thigh, initial encounter: Secondary | ICD-10-CM | POA: Diagnosis not present

## 2017-11-23 DIAGNOSIS — S40861A Insect bite (nonvenomous) of right upper arm, initial encounter: Secondary | ICD-10-CM | POA: Diagnosis not present

## 2017-11-23 MED ORDER — TRIAMCINOLONE ACETONIDE 0.1 % EX CREA: 1.0000 "application " | TOPICAL_CREAM | Freq: Two times a day (BID) | CUTANEOUS | 0 refills | Status: DC

## 2017-11-23 NOTE — Progress Notes (Signed)
Subjective:  Patient ID: Christine Walker, female    DOB: 18-Jul-1950  Age: 67 y.o. MRN: [id number]  CC: No chief complaint on file.   HPI Christine Walker presents for patient reports a 2 to 3-day history of a fixed pruritic and slightly burning rash about her arms and legs.  There is one place on her right lower abdomen.  The bumps are discrete, fixed, burn and itch.  She cannot recall being bitten by an insect.  None of her medicines or skin contacts are new.  She had been staying in a motel in [place] prior to onset.  She is not aware of whether or not her travel partner is affected.  Outpatient Medications Prior to Visit  Medication Sig Dispense Refill  . aspirin 81 MG chewable tablet Chew 1 tablet (81 mg total) by mouth daily.    Marland Kitchen atorvastatin (LIPITOR) 80 MG tablet Take 1 tablet (80 mg total) by mouth daily. 90 tablet 2  . BLACK CURRANT SEED OIL PO Take 1 tablet by mouth daily.    . cyclobenzaprine (FLEXERIL) 10 MG tablet Take 1 tablet (10 mg total) by mouth 3 (three) times daily as needed for muscle spasms. 30 tablet 0  . Fish Oil OIL Take 2 capsules by mouth daily.     . hydrochlorothiazide (MICROZIDE) 12.5 MG capsule TAKE 1 CAPSULE BY MOUTH EVERY DAY 30 capsule 5  . hydrocortisone cream 1 % Apply 1 application topically daily. Uses for facial discoloration.    Marland Kitchen JANUVIA 100 MG tablet TAKE 1 TABLET BY MOUTH EVERY DAY 90 tablet 0  . lidocaine (LIDODERM) 5 % Place 1 patch onto the skin daily as needed (For pain.). Remove & Discard patch within 12 hours or as directed by MD    . lisinopril (PRINIVIL,ZESTRIL) 5 MG tablet Take 1 tablet (5 mg total) by mouth daily. 90 tablet 3  . metoprolol tartrate (LOPRESSOR) 50 MG tablet TAKE 1 TABLET BY MOUTH TWICE A DAY 60 tablet 9  . nitroGLYCERIN (NITROSTAT) 0.4 MG SL tablet Place 1 tablet (0.4 mg total) under the tongue every 5 (five) minutes as needed for chest pain. 25 tablet 3  . ONETOUCH DELICA LANCETS FINE MISC USE TO CHECK FASTING CBG ONCE  DAILY. 100 each 1  . ONETOUCH VERIO test strip USE AS DIRECTED ONCE DAILY TO CHECK BLOOD SUGAR DXE11.9 100 each 1  . potassium chloride SA (KLOR-CON M20) 20 MEQ tablet Take 1 tablet (20 mEq total) by mouth daily. 30 tablet 5  . traMADol (ULTRAM) 50 MG tablet Take 1 tablet (50 mg total) by mouth every 8 (eight) hours as needed. 30 tablet 0  . triamcinolone cream (KENALOG) 0.1 % Apply 1 application topically 2 (two) times daily. 30 g 0   No facility-administered medications prior to visit.     ROS Review of Systems  Constitutional: Negative for fatigue, fever and unexpected weight change.  Respiratory: Negative.   Cardiovascular: Negative.   Gastrointestinal: Negative.   Musculoskeletal: Negative for arthralgias and myalgias.  Skin: Positive for color change and rash. Negative for wound.  Neurological: Negative.  Negative for numbness and headaches.  Hematological: Does not bruise/bleed easily.  Psychiatric/Behavioral: Negative.     Objective:  BP 110/70   Pulse 73   Ht 5\' 7"  (1.702 m)   SpO2 97%   BMI 25.25 kg/m   BP Readings from Last 3 Encounters:  11/23/17 110/70  11/16/17 131/65  05/04/17 98/62    Wt Readings from Last 3  Encounters:  11/16/17 161 lb 3.2 oz (73.1 kg)  05/04/17 157 lb 6.4 oz (71.4 kg)  02/22/17 158 lb (71.7 kg)    Physical Exam  Constitutional: She is oriented to person, place, and time. She appears well-developed and well-nourished. No distress.  HENT:  Head: Normocephalic and atraumatic.  Right Ear: External ear normal.  Left Ear: External ear normal.  Eyes: Right eye exhibits no discharge. Left eye exhibits no discharge. No scleral icterus.  Neck: No JVD present. No tracheal deviation present.  Pulmonary/Chest: Effort normal.  Musculoskeletal: She exhibits no edema.  Neurological: She is alert and oriented to person, place, and time.  Skin: Skin is warm. She is not diaphoretic.     Psychiatric: She has a normal mood and affect. Her behavior  is normal.    Lab Results  Component Value Date   WBC 4.6 12/24/2015   HGB 14.0 12/24/2015   HCT 40.6 12/24/2015   PLT 283.0 12/24/2015   GLUCOSE 123 (H) 11/16/2017   CHOL 141 11/16/2017   TRIG 62.0 11/16/2017   HDL 63.00 11/16/2017   LDLDIRECT 128.0 06/24/2015   LDLCALC 66 11/16/2017   ALT 13 11/16/2017   AST 18 11/16/2017   NA 140 11/16/2017   K 4.8 11/16/2017   CL 102 11/16/2017   CREATININE 0.87 11/16/2017   BUN 13 11/16/2017   CO2 33 (H) 11/16/2017   TSH 0.81 08/18/2016   INR 0.88 08/28/2015   HGBA1C 6.7 (H) 11/16/2017   MICROALBUR 1.5 08/18/2016    Mm Screening Breast Tomo Bilateral  Result Date: 12/09/2016 CLINICAL DATA:  Screening. EXAM: 2D DIGITAL SCREENING BILATERAL MAMMOGRAM WITH CAD AND ADJUNCT TOMO COMPARISON:  Previous exam(s). ACR Breast Density Category c: The breast tissue is heterogeneously dense, which may obscure small masses. FINDINGS: There are no findings suspicious for malignancy. Images were processed with CAD. IMPRESSION: No mammographic evidence of malignancy. A result letter of this screening mammogram will be mailed directly to the patient. RECOMMENDATION: Screening mammogram in one year. (Code:SM-B-01Y) BI-RADS CATEGORY  1: Negative. Electronically Signed   By: [name] M.D.   On: 12/09/2016 08:06    Assessment & Plan:   Diagnoses and all orders for this visit:  Insect bite, unspecified site, initial encounter -     triamcinolone cream (KENALOG) 0.1 %; Apply 1 application topically 2 (two) times daily.   I am having [name]. [name] maintain her Fish Oil, nitroGLYCERIN, hydrocortisone cream, lidocaine, aspirin, BLACK CURRANT SEED OIL PO, ONETOUCH DELICA LANCETS FINE, ONETOUCH VERIO, atorvastatin, metoprolol tartrate, cyclobenzaprine, traMADol, hydrochlorothiazide, potassium chloride SA, JANUVIA, lisinopril, and triamcinolone cream.  Meds ordered this encounter  Medications  . triamcinolone cream (KENALOG) 0.1 %    Sig: Apply 1  application topically 2 (two) times daily.    Dispense:  45 g    Refill:  0     Follow-up: Return in about 1 week (around 11/30/2017), or if symptoms worsen or fail to improve.  Libby Maw, MD

## 2017-11-23 NOTE — Patient Instructions (Signed)
Insect Bite, Adult An insect bite can make your skin red, itchy, and swollen. Some insects can spread disease to people with a bite. However, most insect bites do not lead to disease, and most are not serious. Follow these instructions at home: Bite area care  Do not scratch the bite area.  Keep the bite area clean and dry.  Wash the bite area every day with soap and water as told by your doctor.  Check the bite area every day for signs of infection. Check for: ? More redness, swelling, or pain. ? Fluid or blood. ? Warmth. ? Pus. Managing pain, itching, and swelling  You may put any of these on the bite area as told by your doctor: ? A baking soda paste. ? Cortisone cream. ? Calamine lotion.  If directed, put ice on the bite area. ? Put ice in a plastic bag. ? Place a towel between your skin and the bag. ? Leave the ice on for 20 minutes, 2-3 times a day. Medicines  Take medicines or put medicines on your skin only as told by your doctor.  If you were prescribed an antibiotic medicine, use it as told by your doctor. Do not stop using the antibiotic even if your condition improves. General instructions  Keep all follow-up visits as told by your doctor. This is important. How is this prevented? To help you have a lower risk of insect bites:  When you are outside, wear clothing that covers your arms and legs.  Use insect repellent. The best insect repellents have: ? An active ingredient of DEET, picaridin, oil of lemon eucalyptus (OLE), or IR3535. ? Higher amounts of DEET or another active ingredient than other repellents have.  If your home windows do not have screens, think about putting some in.  Contact a doctor if:  You have more redness, swelling, or pain in the bite area.  You have fluid, blood, or pus coming from the bite area.  The bite area feels warm.  You have a fever. Get help right away if:  You have joint pain.  You have a rash.  You have  shortness of breath.  You feel more tired or sleepy than you normally do.  You have neck pain.  You have a headache.  You feel weaker than you normally do.  You have chest pain.  You have pain in your belly.  You feel sick to your stomach (nauseous) or you throw up (vomit). Summary  An insect bite can make your skin red, itchy, and swollen.  Do not scratch the bite area, and keep it clean and dry.  Ice can help with pain and itching from the bite. This information is not intended to replace advice given to you by your health care provider. Make sure you discuss any questions you have with your health care provider. Document Released: 03/27/2000 Document Revised: 10/31/2015 Document Reviewed: 08/15/2014 Elsevier Interactive Patient Education  2018 [employer] Inc[employer]  

## 2017-12-31 ENCOUNTER — Other Ambulatory Visit: Payer: Self-pay | Admitting: Cardiology

## 2017-12-31 ENCOUNTER — Other Ambulatory Visit: Payer: Self-pay | Admitting: Family Medicine

## 2017-12-31 DIAGNOSIS — I10 Essential (primary) hypertension: Secondary | ICD-10-CM

## 2017-12-31 DIAGNOSIS — I2510 Atherosclerotic heart disease of native coronary artery without angina pectoris: Secondary | ICD-10-CM

## 2017-12-31 DIAGNOSIS — Z8679 Personal history of other diseases of the circulatory system: Secondary | ICD-10-CM

## 2017-12-31 DIAGNOSIS — E785 Hyperlipidemia, unspecified: Secondary | ICD-10-CM

## 2018-01-03 ENCOUNTER — Encounter: Payer: Self-pay | Admitting: Family Medicine

## 2018-01-03 ENCOUNTER — Ambulatory Visit (INDEPENDENT_AMBULATORY_CARE_PROVIDER_SITE_OTHER): Payer: Medicare Other | Admitting: Family Medicine

## 2018-01-03 VITALS — BP 128/60 | HR 61 | Temp 99.4°F | Resp 16 | Ht 67.0 in | Wt 159.0 lb

## 2018-01-03 DIAGNOSIS — R059 Cough, unspecified: Secondary | ICD-10-CM

## 2018-01-03 DIAGNOSIS — J302 Other seasonal allergic rhinitis: Secondary | ICD-10-CM | POA: Diagnosis not present

## 2018-01-03 DIAGNOSIS — R05 Cough: Secondary | ICD-10-CM | POA: Diagnosis not present

## 2018-01-03 MED ORDER — PREDNISONE 10 MG PO TABS: ORAL_TABLET | ORAL | 0 refills | Status: DC

## 2018-01-03 MED ORDER — PROMETHAZINE-DM 6.25-15 MG/5ML PO SYRP: 5.0000 mL | ORAL_SOLUTION | Freq: Four times a day (QID) | ORAL | 0 refills | Status: DC | PRN

## 2018-01-03 MED ORDER — LEVOCETIRIZINE DIHYDROCHLORIDE 5 MG PO TABS: 5.0000 mg | ORAL_TABLET | Freq: Every evening | ORAL | 5 refills | Status: DC

## 2018-01-03 NOTE — Patient Instructions (Signed)
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
## 2018-01-05 ENCOUNTER — Other Ambulatory Visit: Payer: Self-pay | Admitting: Internal Medicine

## 2018-01-05 ENCOUNTER — Telehealth: Payer: Self-pay | Admitting: *Deleted

## 2018-01-05 DIAGNOSIS — R05 Cough: Secondary | ICD-10-CM

## 2018-01-05 DIAGNOSIS — R059 Cough, unspecified: Secondary | ICD-10-CM

## 2018-01-05 NOTE — Telephone Encounter (Signed)
Called patient back regaring order laced for Chest XR. Unable to leave message d/t phone.Marland Kitchen

## 2018-01-05 NOTE — Telephone Encounter (Signed)
Patient is calling back and states that she had insect bites from when she was in [place] last week and now has a cough. She is not sure if this is coming from it or not. She would like to get lab work done as well as a return call from the nurse.   [phone]

## 2018-01-05 NOTE — Telephone Encounter (Signed)
Returned patients call. Unable to leave message for a call back because her mailbox is full.

## 2018-01-05 NOTE — Telephone Encounter (Signed)
Patient was in the office on 02/02/18 regarding insect bites she received while out of town in [place]. States she has now developed a cough and feels she needs to have labwork drawn. Advised I would sent message to Dr. Carollee Herter who is out of the office today. Patient insists another provider in this office be notified so the matter can be addressed today if possible. Please advise

## 2018-01-05 NOTE — Telephone Encounter (Signed)
Copied from [place] [phone]. Topic: Inquiry >> Jan 05, 2018  8:52 AM Oliver Pila B wrote: Reason for CRM: pt called to inquire about her visit on the 23rd; pt would like to speak w/ the nurse

## 2018-01-05 NOTE — Telephone Encounter (Signed)
I reviewed the office visit note, they were considering a chest x-ray. Advised patient: Get a chest x-ray, DX cough.  Further advised with results.

## 2018-01-07 NOTE — Telephone Encounter (Signed)
Able to reach patient. Informed her order had been given and placed for Chest XR due to cough,.

## 2018-01-11 ENCOUNTER — Ambulatory Visit (HOSPITAL_BASED_OUTPATIENT_CLINIC_OR_DEPARTMENT_OTHER)
Admission: RE | Admit: 2018-01-11 | Discharge: 2018-01-11 | Disposition: A | Discharge status: Home / Self Care | Payer: Medicare Other | Source: Ambulatory Visit | Attending: Internal Medicine | Admitting: Internal Medicine

## 2018-01-11 DIAGNOSIS — R05 Cough: Secondary | ICD-10-CM | POA: Insufficient documentation

## 2018-01-11 DIAGNOSIS — R059 Cough, unspecified: Secondary | ICD-10-CM

## 2018-01-11 NOTE — Telephone Encounter (Signed)
Patient called back and stated she did not go to her xray. She wanted to re-sch the appointment so I gave her the number and she is going to call to re-sch.

## 2018-01-27 ENCOUNTER — Other Ambulatory Visit: Payer: Self-pay | Admitting: Family Medicine

## 2018-01-30 ENCOUNTER — Other Ambulatory Visit: Payer: Self-pay | Admitting: Cardiology

## 2018-01-30 DIAGNOSIS — Z8679 Personal history of other diseases of the circulatory system: Secondary | ICD-10-CM

## 2018-01-30 DIAGNOSIS — E785 Hyperlipidemia, unspecified: Secondary | ICD-10-CM

## 2018-01-30 DIAGNOSIS — I2510 Atherosclerotic heart disease of native coronary artery without angina pectoris: Secondary | ICD-10-CM

## 2018-01-30 DIAGNOSIS — I10 Essential (primary) hypertension: Secondary | ICD-10-CM

## 2018-01-31 ENCOUNTER — Other Ambulatory Visit: Payer: Self-pay | Admitting: Endocrinology

## 2018-01-31 DIAGNOSIS — E049 Nontoxic goiter, unspecified: Secondary | ICD-10-CM

## 2018-02-03 ENCOUNTER — Ambulatory Visit
Admission: RE | Admit: 2018-02-03 | Discharge: 2018-02-03 | Disposition: A | Discharge status: Home / Self Care | Payer: Medicare Other | Source: Ambulatory Visit | Attending: Endocrinology | Admitting: Endocrinology

## 2018-02-03 DIAGNOSIS — E049 Nontoxic goiter, unspecified: Secondary | ICD-10-CM

## 2018-02-14 ENCOUNTER — Other Ambulatory Visit: Payer: Self-pay | Admitting: Obstetrics and Gynecology

## 2018-02-14 DIAGNOSIS — Z1231 Encounter for screening mammogram for malignant neoplasm of breast: Secondary | ICD-10-CM

## 2018-02-22 LAB — HM DIABETES EYE EXAM

## 2018-02-23 ENCOUNTER — Ambulatory Visit
Admission: RE | Admit: 2018-02-23 | Discharge: 2018-02-23 | Disposition: A | Discharge status: Home / Self Care | Payer: Medicare Other | Source: Ambulatory Visit | Attending: Obstetrics and Gynecology | Admitting: Obstetrics and Gynecology

## 2018-02-23 DIAGNOSIS — Z1231 Encounter for screening mammogram for malignant neoplasm of breast: Secondary | ICD-10-CM

## 2018-03-03 ENCOUNTER — Encounter: Payer: Self-pay | Admitting: Family Medicine

## 2018-03-03 ENCOUNTER — Ambulatory Visit (INDEPENDENT_AMBULATORY_CARE_PROVIDER_SITE_OTHER): Payer: Medicare Other | Admitting: Family Medicine

## 2018-03-03 VITALS — BP 122/60 | HR 70 | Temp 98.5°F | Resp 16 | Ht 67.0 in | Wt 156.4 lb

## 2018-03-03 DIAGNOSIS — R05 Cough: Secondary | ICD-10-CM | POA: Diagnosis not present

## 2018-03-03 DIAGNOSIS — R059 Cough, unspecified: Secondary | ICD-10-CM

## 2018-03-03 DIAGNOSIS — I10 Essential (primary) hypertension: Secondary | ICD-10-CM | POA: Diagnosis not present

## 2018-03-03 MED ORDER — LORATADINE 10 MG PO TABS: 10.0000 mg | ORAL_TABLET | Freq: Every day | ORAL | 11 refills | Status: DC

## 2018-03-03 MED ORDER — HYDROCODONE-HOMATROPINE 5-1.5 MG/5ML PO SYRP: ORAL_SOLUTION | ORAL | 0 refills | Status: DC

## 2018-03-03 MED ORDER — LOSARTAN POTASSIUM 25 MG PO TABS: 25.0000 mg | ORAL_TABLET | Freq: Every day | ORAL | 1 refills | Status: DC
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
## 2018-03-03 NOTE — Assessment & Plan Note (Signed)
Switch ;lisinopril to losartan to see if cough resolves

## 2018-03-03 NOTE — Patient Instructions (Signed)
Stop Lisinopril  Start losartan A common side effect of lisinopril is a dry cough

## 2018-03-03 NOTE — Assessment & Plan Note (Addendum)
Dry cough ? If from lisinopril D/c lisinopril and start losartan  rto prn Consider pulmonary referral

## 2018-03-13 ENCOUNTER — Other Ambulatory Visit: Payer: Self-pay | Admitting: Interventional Cardiology

## 2018-03-13 DIAGNOSIS — Z8679 Personal history of other diseases of the circulatory system: Secondary | ICD-10-CM

## 2018-03-13 DIAGNOSIS — E785 Hyperlipidemia, unspecified: Secondary | ICD-10-CM

## 2018-03-13 DIAGNOSIS — I2510 Atherosclerotic heart disease of native coronary artery without angina pectoris: Secondary | ICD-10-CM

## 2018-03-13 DIAGNOSIS — I10 Essential (primary) hypertension: Secondary | ICD-10-CM

## 2018-03-14 ENCOUNTER — Other Ambulatory Visit: Payer: Self-pay | Admitting: Family Medicine

## 2018-03-14 DIAGNOSIS — R059 Cough, unspecified: Secondary | ICD-10-CM

## 2018-03-14 DIAGNOSIS — R05 Cough: Secondary | ICD-10-CM

## 2018-03-14 NOTE — Telephone Encounter (Signed)
Copied from [place] [id number]. Topic: Quick Communication - Rx Refill/Question >> Mar 14, 2018 11:46 AM Ahmed Prima L wrote: Medication: HYDROcodone-homatropine (HYCODAN) 5-1.5 MG/5ML syrup  Has the patient contacted their pharmacy? Yes and told her to call office (Agent: If no, request that the patient contact the pharmacy for the refill.) (Agent: If yes, when and what did the pharmacy advise?)  Preferred Pharmacy (with phone number or street name):   Agent: Please be advised that RX refills may take up to 3 business days. We ask that you follow-up with your pharmacy.

## 2018-03-18 NOTE — Addendum Note (Signed)
Addended by: Matilde Sprang on: 03/18/2018 05:03 PM   Modules accepted: Orders

## 2018-03-18 NOTE — Telephone Encounter (Signed)
Requested medication (s) are due for refill today: Yes  Requested medication (s) are on the active medication list: Yes  Last refill:  03/03/18  Future visit scheduled: No  Notes to clinic:  Unable to refill per protocol     Requested Prescriptions  Pending Prescriptions Disp Refills   HYDROcodone-homatropine (HYCODAN) 5-1.5 MG/5ML syrup 120 mL 0    Sig: 1 tsp po qhs prn     Off-Protocol Failed - 03/18/2018  5:03 PM      Failed - Medication not assigned to a protocol, review manually.      Passed - Valid encounter within last 12 months    Recent Outpatient Visits          2 weeks ago Cough   [facility] [place], [place]   2 months ago Seasonal allergies   [facility] [place], [place]   3 months ago Insect bite, unspecified site, initial encounter   [facility] [place], Mortimer Fries, MD   4 months ago Frequency of urination   [facility] [place], [place]   10 months ago Chronic right-sided low back pain with right-sided sciatica   [facility] [place], [place]

## 2018-03-18 NOTE — Telephone Encounter (Signed)
Pt calling because she still hasn't heard if this will be filled for her and it has been over 3 days.

## 2018-03-21 NOTE — Telephone Encounter (Signed)
Pt wants to know why this is being denied.  Pt states that she still has cough and tingling in throat. Pt states that she has been to see provider for this twice and it is not going away - she wants to know if she needs to see a specialist. Pt can be reached at [phone]

## 2018-03-22 ENCOUNTER — Other Ambulatory Visit: Payer: Self-pay | Admitting: Family Medicine

## 2018-03-22 DIAGNOSIS — R05 Cough: Secondary | ICD-10-CM

## 2018-03-22 DIAGNOSIS — R058 Other specified cough: Secondary | ICD-10-CM

## 2018-03-22 DIAGNOSIS — R059 Cough, unspecified: Secondary | ICD-10-CM

## 2018-03-22 MED ORDER — HYDROCODONE-HOMATROPINE 5-1.5 MG/5ML PO SYRP: ORAL_SOLUTION | ORAL | 0 refills | Status: DC

## 2018-03-22 NOTE — Telephone Encounter (Signed)
Do you want to see her again?

## 2018-03-22 NOTE — Telephone Encounter (Signed)
Refill cough med---- refer to pulmonary

## 2018-03-24 ENCOUNTER — Encounter: Payer: Self-pay | Admitting: Family Medicine

## 2018-03-25 ENCOUNTER — Other Ambulatory Visit: Payer: Self-pay | Admitting: Family Medicine

## 2018-03-25 DIAGNOSIS — I10 Essential (primary) hypertension: Secondary | ICD-10-CM

## 2018-03-28 ENCOUNTER — Telehealth: Payer: Self-pay | Admitting: Family Medicine

## 2018-03-28 ENCOUNTER — Other Ambulatory Visit: Payer: Self-pay | Admitting: Family Medicine

## 2018-03-28 DIAGNOSIS — R05 Cough: Secondary | ICD-10-CM

## 2018-03-28 DIAGNOSIS — R059 Cough, unspecified: Secondary | ICD-10-CM

## 2018-03-28 NOTE — Telephone Encounter (Signed)
Refer to pulmonary

## 2018-03-28 NOTE — Telephone Encounter (Signed)
I thought referral was put in last week but I put it in again

## 2018-04-19 ENCOUNTER — Encounter: Payer: Self-pay | Admitting: Internal Medicine

## 2018-04-19 ENCOUNTER — Ambulatory Visit (INDEPENDENT_AMBULATORY_CARE_PROVIDER_SITE_OTHER): Payer: Medicare Other | Admitting: Internal Medicine

## 2018-04-19 VITALS — BP 104/60 | HR 60 | Ht 67.0 in | Wt 155.0 lb

## 2018-04-19 DIAGNOSIS — R059 Cough, unspecified: Secondary | ICD-10-CM

## 2018-04-19 DIAGNOSIS — R05 Cough: Secondary | ICD-10-CM

## 2018-04-19 DIAGNOSIS — I10 Essential (primary) hypertension: Secondary | ICD-10-CM

## 2018-04-19 NOTE — Patient Instructions (Signed)
GERD (REFLUX)  is an extremely common cause of respiratory symptoms just like yours , many times with no obvious heartburn at all.    It can be treated with medication, but also with lifestyle changes including elevation of the head of your bed (ideally with 6 -8inch blocks under the headboard of your bed),  Smoking cessation, avoidance of late meals, excessive alcohol, and avoid fatty foods, chocolate, peppermint, colas, red wine, and acidic juices such as orange juice.  NO MINT OR MENTHOL PRODUCTS SO NO COUGH DROPS  USE SUGARLESS CANDY INSTEAD (Jolley ranchers or Stover's or Life Savers) or even ice chips will also do - the key is to swallow to prevent all throat clearing. NO OIL BASED VITAMINS - use powdered substitutes.  Avoid fish oil when coughing.    If still coughing > Try prilosec otc 20mg   Take 30-60 min before first meal of the day and Pepcid ac (famotidine) 20 mg one @  bedtime until cough is completely gone for at least a week without the need for cough suppression     If not better to your satisfaction then return after the 1st of Feb 2020
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
## 2018-04-19 NOTE — Assessment & Plan Note (Signed)
In the best review of chronic cough to date ( NEJM 2016 375 [phone]) ,  ACEi are now felt to cause cough in up to  20% of pts which is a 4 fold increase from previous reports and does not include the variety of non-specific complaints we see in pulmonary clinic in pts on ACEi but previously attributed to another dx like  Copd/asthma and  include PNDS, throat congestion and chest congestion, "bronchitis", unexplained dyspnea and choking or noct "strangling" sensations, and hoarseness, but also  atypical /refractory GERD symptoms like dysphagia and "bad heartburn"   The only way I know  to prove this is not an "ACEi Case" continue off ACEi x a minimum of 6 weeks then regroup which would be for her after May 14 2018  Discussed in detail all the  indications, usual  risks and alternatives  relative to the benefits with patient who agrees to proceed with conservative w/u and rx  as outlined     Total time devoted to counseling  > 50 % of initial 45 min office visit:  review case with pt/ discussion of options/alternatives/ personally creating written customized instructions  in presence of pt  then going over those specific  Instructions directly with the pt including how to use all of the meds but in particular covering each new medication in detail and the difference between the maintenance= "automatic" meds and the prns using an action plan format for the latter (If this problem/symptom => do that organization reading Left to right).  Please see AVS from this visit for a full list of these instructions which I personally wrote for this pt and  are unique to this visit.    Marland Kitchen

## 2018-04-19 NOTE — Assessment & Plan Note (Signed)
Off acei since 03/30/18 > improving  As of 04/19/2018 added gerd diet then add short term acid suppression if not 100%   This is classic Upper airway cough syndrome (previously labeled PNDS),  is so named because it's frequently impossible to sort out how much is  CR/sinusitis with freq throat clearing (which can be related to primary GERD)   vs  causing  secondary (" extra esophageal")  GERD from wide swings in gastric pressure that occur with throat clearing, often  promoting self use of mint and menthol lozenges that reduce the lower esophageal sphincter tone and exacerbate the problem further in a cyclical fashion.   These are the same pts (now being labeled as having "irritable larynx syndrome" by some cough centers) who not infrequently have a history of having failed to tolerate ace inhibitors,  dry powder inhalers or biphosphonates or report having atypical/extraesophageal reflux symptoms that don't respond to standard doses of PPI  and are easily confused as having aecopd or asthma flares by even experienced allergists/ pulmonologists (myself included).   >>> If not all better on gerd diet/ add otc acid suppression then return in one month prn

## 2018-04-30 ENCOUNTER — Other Ambulatory Visit: Payer: Self-pay | Admitting: Interventional Cardiology

## 2018-04-30 DIAGNOSIS — I10 Essential (primary) hypertension: Secondary | ICD-10-CM

## 2018-04-30 DIAGNOSIS — E785 Hyperlipidemia, unspecified: Secondary | ICD-10-CM

## 2018-04-30 DIAGNOSIS — Z8679 Personal history of other diseases of the circulatory system: Secondary | ICD-10-CM

## 2018-04-30 DIAGNOSIS — I2510 Atherosclerotic heart disease of native coronary artery without angina pectoris: Secondary | ICD-10-CM

## 2018-05-04 ENCOUNTER — Encounter: Payer: Self-pay | Admitting: Internal Medicine

## 2018-05-04 ENCOUNTER — Ambulatory Visit (INDEPENDENT_AMBULATORY_CARE_PROVIDER_SITE_OTHER): Payer: Medicare Other | Admitting: Internal Medicine

## 2018-05-04 VITALS — BP 128/60 | HR 50 | Temp 97.9°F | Ht 67.0 in | Wt 158.0 lb

## 2018-05-04 DIAGNOSIS — R059 Cough, unspecified: Secondary | ICD-10-CM

## 2018-05-04 DIAGNOSIS — R05 Cough: Secondary | ICD-10-CM

## 2018-05-04 LAB — NITRIC OXIDE: NITRIC OXIDE: 8

## 2018-05-04 MED ORDER — FAMOTIDINE 20 MG PO TABS: ORAL_TABLET | ORAL | 11 refills | Status: DC

## 2018-05-04 MED ORDER — BENZONATATE 200 MG PO CAPS: 200.0000 mg | ORAL_CAPSULE | Freq: Three times a day (TID) | ORAL | 1 refills | Status: DC | PRN

## 2018-05-04 MED ORDER — PANTOPRAZOLE SODIUM 40 MG PO TBEC: 40.0000 mg | DELAYED_RELEASE_TABLET | Freq: Every day | ORAL | 2 refills | Status: DC

## 2018-05-04 NOTE — Assessment & Plan Note (Addendum)
Onset Sept 2019 while on acei Off acei since 03/30/18 > improving  As of 04/19/2018 added gerd diet then add short term acid suppression if not 100% > did not do as of 05/04/2018  - FENO 05/04/2018  =   8 > rec add gerd rx/ diet and 1st gen H1 blockers per guidelines      Upper airway cough syndrome (previously labeled PNDS),  is so named because it's frequently impossible to sort out how much is  CR/sinusitis with freq throat clearing (which can be related to primary GERD)   vs  causing  secondary (" extra esophageal")  GERD from wide swings in gastric pressure that occur with throat clearing, often  promoting self use of mint and menthol lozenges that reduce the lower esophageal sphincter tone and exacerbate the problem further in a cyclical fashion.   These are the same pts (now being labeled as having "irritable larynx syndrome" by some cough centers) who not infrequently have a history of having failed to tolerate ace inhibitors,  dry powder inhalers or biphosphonates or report having atypical/extraesophageal reflux symptoms that don't respond to standard doses of PPI  and are easily confused as having aecopd or asthma flares by even experienced allergists/ pulmonologists (myself included).     She has no atopic features and feno is very low so strongly favor uacs > rx with gerd diet/ max acid suppression/ 1st gen H1 blockers per guidelines  And just use tessalon 200 if possible instead of narcotic containing cough meds and if not better do allergy w/u, consider gabapentin trial on return in 4 weeks to regroup   I had an extended discussion with the patient reviewing all relevant studies completed to date and  lasting 15 to 20 minutes of a 25 minute acute office visit    Each maintenance medication was reviewed in detail including most importantly the difference between maintenance and prns and under what circumstances the prns are to be triggered using an action plan format that is not reflected in  the computer generated alphabetically organized AVS.     Please see AVS for specific instructions unique to this visit that I personally wrote and verbalized to the the pt in detail and then reviewed with pt  by my nurse highlighting any  changes in therapy recommended at today's visit to their plan of care.
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
## 2018-05-04 NOTE — Patient Instructions (Addendum)
Pantoprazole (protonix) 40 mg   Take  30-60 min before first meal of the day and Pepcid (famotidine)  20 mg one @  bedtime until return to office - this is the best way to tell whether stomach acid is contributing to your problem.     For cough > tessalon 200 mg one every 6- 8 hour  For drainage / throat tickle try take CHLORPHENIRAMINE  4 mg - take one every 4 hours as needed - available over the counter- may cause drowsiness so start with just a bedtime dose or two and see how you tolerate it before trying in daytime     GERD (REFLUX)  is an extremely common cause of respiratory symptoms just like yours , many times with no obvious heartburn at all.    It can be treated with medication, but also with lifestyle changes including elevation of the head of your bed (ideally with 6 -8inch blocks under the headboard of your bed),  Smoking cessation, avoidance of late meals, excessive alcohol, and avoid fatty foods, chocolate, peppermint, colas, red wine, and acidic juices such as orange juice.  NO MINT OR MENTHOL PRODUCTS SO NO COUGH DROPS  USE SUGARLESS CANDY INSTEAD (Jolley ranchers or Stover's or Life Savers) or even ice chips will also do - the key is to swallow to prevent all throat clearing. NO OIL BASED VITAMINS - use powdered substitutes.  Avoid fish oil when coughing.     Please schedule a follow up office visit in 4 weeks, sooner if needed

## 2018-05-30 ENCOUNTER — Other Ambulatory Visit: Payer: Self-pay | Admitting: Cardiology

## 2018-06-01 ENCOUNTER — Ambulatory Visit: Payer: Medicare Other | Admitting: Internal Medicine

## 2018-07-15 ENCOUNTER — Telehealth: Payer: Self-pay | Admitting: Cardiology

## 2018-07-15 NOTE — Telephone Encounter (Signed)
Called and left patient a message regarding rescheduling her face to face appointment with Dr. Tamala Julian 07/25/2018 to a tele or virtual visit with myself per Dr. Tamala Julian request. If she returns this call, please have this routed to me. I will also try her back a little later in the day.   Thank you Kathyrn Drown NP-C [facility] Pager: [phone]

## 2018-07-17 ENCOUNTER — Telehealth: Payer: Self-pay | Admitting: Interventional Cardiology

## 2018-07-17 NOTE — Telephone Encounter (Signed)
Telehealth video visit

## 2018-07-18 ENCOUNTER — Telehealth: Payer: Medicare Other | Admitting: Interventional Cardiology

## 2018-07-18 NOTE — Telephone Encounter (Signed)
Spoke with pt and she is agreeable to video visit today.  Scheduled her and advised we will call her to get consent and MyChart taken care of.
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
## 2018-07-18 NOTE — Telephone Encounter (Signed)
Left message to call back  

## 2018-07-20 ENCOUNTER — Encounter: Payer: Self-pay | Admitting: Family Medicine

## 2018-07-20 ENCOUNTER — Encounter: Payer: Self-pay | Admitting: Interventional Cardiology

## 2018-07-20 ENCOUNTER — Other Ambulatory Visit: Payer: Self-pay

## 2018-07-20 ENCOUNTER — Telehealth (INDEPENDENT_AMBULATORY_CARE_PROVIDER_SITE_OTHER): Payer: Medicare Other | Admitting: Interventional Cardiology

## 2018-07-20 ENCOUNTER — Ambulatory Visit (INDEPENDENT_AMBULATORY_CARE_PROVIDER_SITE_OTHER): Payer: Medicare Other | Admitting: Family Medicine

## 2018-07-20 ENCOUNTER — Telehealth: Payer: Self-pay

## 2018-07-20 VITALS — BP 133/70 | HR 72 | Ht 67.0 in | Wt 152.0 lb

## 2018-07-20 VITALS — BP 133/70 | HR 72 | Wt 152.0 lb

## 2018-07-20 DIAGNOSIS — E118 Type 2 diabetes mellitus with unspecified complications: Secondary | ICD-10-CM

## 2018-07-20 DIAGNOSIS — I10 Essential (primary) hypertension: Secondary | ICD-10-CM

## 2018-07-20 DIAGNOSIS — I2510 Atherosclerotic heart disease of native coronary artery without angina pectoris: Secondary | ICD-10-CM

## 2018-07-20 DIAGNOSIS — E119 Type 2 diabetes mellitus without complications: Secondary | ICD-10-CM | POA: Diagnosis not present

## 2018-07-20 DIAGNOSIS — E1169 Type 2 diabetes mellitus with other specified complication: Secondary | ICD-10-CM

## 2018-07-20 DIAGNOSIS — E039 Hypothyroidism, unspecified: Secondary | ICD-10-CM | POA: Diagnosis not present

## 2018-07-20 DIAGNOSIS — E785 Hyperlipidemia, unspecified: Secondary | ICD-10-CM | POA: Diagnosis not present

## 2018-07-20 DIAGNOSIS — I4510 Unspecified right bundle-branch block: Secondary | ICD-10-CM | POA: Diagnosis not present

## 2018-07-20 DIAGNOSIS — E782 Mixed hyperlipidemia: Secondary | ICD-10-CM

## 2018-07-20 DIAGNOSIS — IMO0002 Reserved for concepts with insufficient information to code with codable children: Secondary | ICD-10-CM

## 2018-07-20 DIAGNOSIS — E1165 Type 2 diabetes mellitus with hyperglycemia: Secondary | ICD-10-CM

## 2018-07-20 MED ORDER — EMPAGLIFLOZIN 10 MG PO TABS: 10.0000 mg | ORAL_TABLET | Freq: Every day | ORAL | 2 refills | Status: DC

## 2018-07-20 MED ORDER — ATORVASTATIN CALCIUM 80 MG PO TABS: 80.0000 mg | ORAL_TABLET | Freq: Every day | ORAL | 3 refills | Status: DC

## 2018-07-20 NOTE — Progress Notes (Signed)
Already done--- she will start Jardiance----- thanks    Kendrick Fries
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
## 2018-07-20 NOTE — Telephone Encounter (Signed)
Virtual Visit Pre-Appointment Phone Call  Steps For Call:  1. Confirm consent - "In the setting of the current Covid19 crisis, you are scheduled for a (phone or video) visit with your provider on (date) at (time).  Just as we do with many in-office visits, in order for you to participate in this visit, we must obtain consent.  If you'd like, I can send this to your mychart (if signed up) or email for you to review.  Otherwise, I can obtain your verbal consent now.  All virtual visits are billed to your insurance company just like a normal visit would be.  By agreeing to a virtual visit, we'd like you to understand that the technology does not allow for your provider to perform an examination, and thus may limit your provider's ability to fully assess your condition.  Finally, though the technology is pretty good, we cannot assure that it will always work on either your or our end, and in the setting of a video visit, we may have to convert it to a phone-only visit.  In either situation, we cannot ensure that we have a secure connection.  Are you willing to proceed?"  2. Give patient instructions for WebEx download to smartphone as below if video visit  3. Advise patient to be prepared with any vital sign or heart rhythm information, their current medicines, and a piece of paper and pen handy for any instructions they may receive the day of their visit  4. Inform patient they will receive a phone call 15 minutes prior to their appointment time (may be from unknown caller ID) so they should be prepared to answer  5. Confirm that appointment type is correct in Epic appointment notes (video vs telephone)    TELEPHONE CALL NOTE  Christine Walker has been deemed a candidate for a follow-up tele-health visit to limit community exposure during the Covid-19 pandemic. I spoke with the patient via phone to ensure availability of phone/video source, confirm preferred email & phone number, and discuss  instructions and expectations.  I reminded Christine Walker to be prepared with any vital sign and/or heart rhythm information that could potentially be obtained via home monitoring, at the time of her visit. I reminded Christine Walker to expect a phone call at the time of her visit if her visit.  Did the patient verbally acknowledge consent to treatment? Yes  Helaina Stefano, [facility] 07/20/2018 9:43 AM   DOWNLOADING THE [place], go to [employer] and type in WebEx in the search bar. [place] [employer], the blue/green circle. The app is free but as with any other app downloads, their phone may require them to verify saved payment information or Apple password. The patient does NOT have to create an account.  - If Android, ask patient to go to [employer] and type in WebEx in the search bar. [place] [employer], the blue/green circle. The app is free but as with any other app downloads, their phone may require them to verify saved payment information or Android password. The patient does NOT have to create an account.   CONSENT FOR TELE-HEALTH VISIT - PLEASE REVIEW  I hereby voluntarily request, consent and authorize [facility] and its employed or contracted physicians, physician assistants, nurse practitioners or other licensed health care professionals (the Practitioner), to provide me with telemedicine health care services (the "Services") as deemed necessary by the treating Practitioner.  I acknowledge and consent to receive the Services by the Practitioner via telemedicine. I understand that the telemedicine visit will involve communicating with the Practitioner through live audiovisual communication technology and the disclosure of certain medical information by electronic transmission. I acknowledge that I have been given the opportunity to request an in-person assessment or other available alternative prior to the telemedicine visit  and am voluntarily participating in the telemedicine visit.  I understand that I have the right to withhold or withdraw my consent to the use of telemedicine in the course of my care at any time, without affecting my right to future care or treatment, and that the Practitioner or I may terminate the telemedicine visit at any time. I understand that I have the right to inspect all information obtained and/or recorded in the course of the telemedicine visit and may receive copies of available information for a reasonable fee.  I understand that some of the potential risks of receiving the Services via telemedicine include:  Marland Kitchen Delay or interruption in medical evaluation due to technological equipment failure or disruption; . Information transmitted may not be sufficient (e.g. poor resolution of images) to allow for appropriate medical decision making by the Practitioner; and/or  . In rare instances, security protocols could fail, causing a breach of personal health information.  Furthermore, I acknowledge that it is my responsibility to provide information about my medical history, conditions and care that is complete and accurate to the best of my ability. I acknowledge that Practitioner's advice, recommendations, and/or decision may be based on factors not within their control, such as incomplete or inaccurate data provided by me or distortions of diagnostic images or specimens that may result from electronic transmissions. I understand that the practice of medicine is not an exact science and that Practitioner makes no warranties or guarantees regarding treatment outcomes. I acknowledge that I will receive a copy of this consent concurrently upon execution via email to the email address I last provided but may also request a printed copy by calling the office of [place].    I understand that my insurance will be billed for this visit.   I have read or had this consent read to me. . I understand  the contents of this consent, which adequately explains the benefits and risks of the Services being provided via telemedicine.  . I have been provided ample opportunity to ask questions regarding this consent and the Services and have had my questions answered to my satisfaction. . I give my informed consent for the services to be provided through the use of telemedicine in my medical care  By participating in this telemedicine visit I agree to the above.

## 2018-07-20 NOTE — Patient Instructions (Signed)
Medication Instructions:  Your physician recommends that you continue on your current medications as directed. Please refer to the Current Medication list given to you today.  If you need a refill on your cardiac medications before your next appointment, please call your pharmacy.   Lab work: None If you have labs (blood work) drawn today and your tests are completely normal, you will receive your results only by: . MyChart Message (if you have MyChart) OR . A paper copy in the mail If you have any lab test that is abnormal or we need to change your treatment, we will call you to review the results.  Testing/Procedures: None  Follow-Up: At [facility], you and your health needs are our priority.  As part of our continuing mission to provide you with exceptional heart care, we have created designated Provider Care Teams.  These Care Teams include your primary Cardiologist (physician) and Advanced Practice Providers (APPs -  Physician Assistants and Nurse Practitioners) who all work together to provide you with the care you need, when you need it. You will need a follow up appointment in 12 months.  Please call our office 2 months in advance to schedule this appointment.  You may see Henry W Smith III, MD or one of the following Advanced Practice Providers on your designated Care Team:   Lori Gerhardt, NP Laura Ingold, NP . Jill McDaniel, NP  Any Other Special Instructions Will Be Listed Below (If Applicable).  Your provider recommends that you maintain 150 minutes per week of moderate aerobic activity.    

## 2018-07-20 NOTE — Progress Notes (Signed)
Virtual Visit via Video Note  I connected with [name] on 07/20/18 at 11:15 AM EDT by a video enabled telemedicine application and verified that I am speaking with the correct person using two identifiers.   I discussed the limitations of evaluation and management by telemedicine and the availability of in person appointments. The patient expressed understanding and agreed to proceed.  History of Present Illness: Pt home needing f/u bp, chol , dm and thyroid  HPI HYPERTENSION   Blood pressure range-130/70  Chest pain- no      Dyspnea- no Lightheadedness- no   Edema- no  Other side effects - no   Medication compliance: good Low salt diet- yes    DIABETES Blood Sugar ranges-130-140  Polyuria- no New Visual problems- no  Hypoglycemic symptoms- no  Other side effects-no Medication compliance -poor -- stopped [place] because her sisters did  Last ye exam- 4-5 months ago    HYPERLIPIDEMIA  Medication compliance- good RUQ pain- no  Muscle aches- no Other side effects-no   [facility]     Observations/Objective: 133/70    130-140 blood sugars p 72   Afebrile  Wt 152 ht 5 ft 7 in    Pt is NAD rr normal   Assessment and Plan: 1. Diet-controlled diabetes mellitus ([place]) Check labs-- pt stopped [place] because her sisters did-- she does not know whay Start jardiance  - Hemoglobin A1c; Future - empagliflozin (JARDIANCE) 10 MG TABS tablet; Take 10 mg by mouth daily.  Dispense: 30 tablet; Refill: 2  2. Hypothyroidism, unspecified type Check labs stable - TSH; Future  3. Hyperlipidemia associated with type 2 diabetes mellitus ([place]) Tolerating statin, encouraged heart healthy diet, avoid trans fats, minimize simple carbs and saturated fats. Increase exercise as tolerated - Lipid panel; Future - Comprehensive metabolic panel; Future  4. Essential hypertension Well controlled, no changes to meds. Encouraged heart healthy diet such as the DASH diet and  exercise as tolerated.   - Microalbumin / creatinine urine ratio; Future - Comprehensive metabolic panel; Future   Follow Up Instructions: Pt will rto for labs    I discussed the assessment and treatment plan with the patient. The patient was provided an opportunity to ask questions and all were answered. The patient agreed with the plan and demonstrated an understanding of the instructions.   The patient was advised to call back or seek an in-person evaluation if the symptoms worsen or if the condition fails to improve as anticipated.    Ann Held, DO

## 2018-07-25 ENCOUNTER — Ambulatory Visit: Payer: Medicare Other | Admitting: Interventional Cardiology

## 2018-07-25 ENCOUNTER — Telehealth: Payer: Self-pay | Admitting: *Deleted

## 2018-07-25 NOTE — Telephone Encounter (Signed)
-----   Message from Ann Held, DO sent at 07/20/2018 11:27 AM EDT ----- Needs lab app at [facility]

## 2018-07-25 NOTE — Telephone Encounter (Signed)
Patient notified that she can just go over.

## 2018-07-29 ENCOUNTER — Other Ambulatory Visit: Payer: Self-pay | Admitting: Cardiology

## 2018-07-29 DIAGNOSIS — I10 Essential (primary) hypertension: Secondary | ICD-10-CM

## 2018-09-28 ENCOUNTER — Other Ambulatory Visit: Payer: Self-pay | Admitting: Cardiology

## 2018-09-29 ENCOUNTER — Other Ambulatory Visit: Payer: Self-pay | Admitting: Family Medicine

## 2018-09-29 DIAGNOSIS — IMO0001 Reserved for inherently not codable concepts without codable children: Secondary | ICD-10-CM

## 2018-09-30 ENCOUNTER — Other Ambulatory Visit: Payer: Self-pay | Admitting: Family Medicine

## 2018-10-26 ENCOUNTER — Other Ambulatory Visit: Payer: Self-pay | Admitting: Family Medicine

## 2018-10-26 DIAGNOSIS — E119 Type 2 diabetes mellitus without complications: Secondary | ICD-10-CM

## 2018-11-17 ENCOUNTER — Other Ambulatory Visit: Payer: Self-pay | Admitting: *Deleted

## 2018-11-17 MED ORDER — HYDROCHLOROTHIAZIDE 12.5 MG PO CAPS: ORAL_CAPSULE | ORAL | 1 refills | Status: DC

## 2019-01-18 ENCOUNTER — Telehealth: Payer: Self-pay | Admitting: *Deleted

## 2019-01-18 ENCOUNTER — Ambulatory Visit: Payer: Self-pay | Admitting: *Deleted

## 2019-01-18 NOTE — Telephone Encounter (Signed)
Opened this chart twice by mistake.

## 2019-01-18 NOTE — Telephone Encounter (Signed)
I returned her call regarding her medications. She called pharmacy last night to refill and they said she was "taking 3 medications that were for the same thing".     HCTZ, Jardiance, and metoprolol are the ones in question.  I went over her entire list and she is on everything listed.  The Januvia was stopped and Jardiance started at the [facility] in April.   "I stopped the Januvia because my sister told me it was bad for me".    All the medications listed are correct.     She mentioned her glucose is 70-110.    She is not checking her sugar every day.     The pharmacist said I haven't had the losartan filled since March.    She kept repeating her questions and what medications were for what.    I went over all her medications in detail again for her.   She kept asking what medications were for what and should she come in to see about coming off of them.    I let her know she saw Dr. Carollee Herter in April and that everything she was on is correct according to her medication list.    I answered all her questions including the ones she asked about again.  I get the impression she has misunderstood what the pharmacist told her because she kept getting the reasons for what each medication was for mixed up even after I would explain it to her.  I have forwarded this note to Dr. Carollee Herter so she would be aware of the situation.   She may questionably need help with her medications?

## 2019-01-18 NOTE — Telephone Encounter (Signed)
FYI. I scheduled her a appointment on Tuesday in person to discuss meds.

## 2019-01-18 NOTE — Telephone Encounter (Signed)
Pharmacist stated there was no duplicates on file.  She checked the profile.

## 2019-01-18 NOTE — Telephone Encounter (Signed)
I can see her but can you call the pharmacy to see what they were trying to tell her?

## 2019-01-23 ENCOUNTER — Other Ambulatory Visit: Payer: Self-pay

## 2019-01-24 ENCOUNTER — Ambulatory Visit (INDEPENDENT_AMBULATORY_CARE_PROVIDER_SITE_OTHER): Payer: Medicare Other | Admitting: Family Medicine

## 2019-01-24 ENCOUNTER — Encounter: Payer: Self-pay | Admitting: Family Medicine

## 2019-01-24 VITALS — BP 120/80 | HR 71 | Temp 97.6°F | Resp 18 | Ht 67.0 in | Wt 152.0 lb

## 2019-01-24 DIAGNOSIS — L659 Nonscarring hair loss, unspecified: Secondary | ICD-10-CM

## 2019-01-24 DIAGNOSIS — I341 Nonrheumatic mitral (valve) prolapse: Secondary | ICD-10-CM

## 2019-01-24 DIAGNOSIS — I10 Essential (primary) hypertension: Secondary | ICD-10-CM | POA: Diagnosis not present

## 2019-01-24 DIAGNOSIS — E785 Hyperlipidemia, unspecified: Secondary | ICD-10-CM | POA: Diagnosis not present

## 2019-01-24 DIAGNOSIS — E119 Type 2 diabetes mellitus without complications: Secondary | ICD-10-CM | POA: Diagnosis not present

## 2019-01-24 DIAGNOSIS — E11649 Type 2 diabetes mellitus with hypoglycemia without coma: Secondary | ICD-10-CM

## 2019-01-24 DIAGNOSIS — E1169 Type 2 diabetes mellitus with other specified complication: Secondary | ICD-10-CM

## 2019-01-24 DIAGNOSIS — E782 Mixed hyperlipidemia: Secondary | ICD-10-CM

## 2019-01-24 LAB — COMPREHENSIVE METABOLIC PANEL WITH GFR
ALT: 11 U/L (ref 0–35)
AST: 16 U/L (ref 0–37)
Albumin: 4.5 g/dL (ref 3.5–5.2)
Alkaline Phosphatase: 63 U/L (ref 39–117)
BUN: 22 mg/dL (ref 6–23)
CO2: 29 meq/L (ref 19–32)
Calcium: 10 mg/dL (ref 8.4–10.5)
Chloride: 102 meq/L (ref 96–112)
Creatinine, Ser: 0.85 mg/dL (ref 0.40–1.20)
GFR: 80.88 mL/min
Glucose, Bld: 100 mg/dL — ABNORMAL HIGH (ref 70–99)
Potassium: 4.2 meq/L (ref 3.5–5.1)
Sodium: 140 meq/L (ref 135–145)
Total Bilirubin: 0.8 mg/dL (ref 0.2–1.2)
Total Protein: 6.8 g/dL (ref 6.0–8.3)

## 2019-01-24 LAB — MICROALBUMIN / CREATININE URINE RATIO
Creatinine,U: 130.1 mg/dL
Microalb Creat Ratio: 0.5 mg/g (ref 0.0–30.0)
Microalb, Ur: <0.7 mg/dL (ref 0.0–1.9)

## 2019-01-24 LAB — LIPID PANEL
Cholesterol: 185 mg/dL (ref 0–200)
HDL: 69.9 mg/dL
LDL Cholesterol: 102 mg/dL — ABNORMAL HIGH (ref 0–99)
NonHDL: 114.65
Total CHOL/HDL Ratio: 3
Triglycerides: 62 mg/dL (ref 0.0–149.0)
VLDL: 12.4 mg/dL (ref 0.0–40.0)

## 2019-01-24 LAB — HEMOGLOBIN A1C: Hgb A1c MFr Bld: 7 % — ABNORMAL HIGH (ref 4.6–6.5)

## 2019-01-24 LAB — B12 AND FOLATE PANEL
Folate: 20.8 ng/mL (ref >5.9)
Vitamin B-12: 450 pg/mL (ref 211–911)

## 2019-01-24 MED ORDER — LOSARTAN POTASSIUM 25 MG PO TABS: 25.0000 mg | ORAL_TABLET | Freq: Every day | ORAL | 1 refills | Status: DC

## 2019-01-24 NOTE — Assessment & Plan Note (Signed)
bs running low per pt Check labs  Hold jardiance for now--- due to weight loss ---  May be able to control with diet

## 2019-01-24 NOTE — Assessment & Plan Note (Signed)
Per cardiology 

## 2019-01-24 NOTE — Assessment & Plan Note (Signed)
Tolerating statin, encouraged heart healthy diet, avoid trans fats, minimize simple carbs and saturated fats. Increase exercise as tolerated 
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
## 2019-01-24 NOTE — Assessment & Plan Note (Signed)
Well controlled, no changes to meds. Encouraged heart healthy diet such as the DASH diet and exercise as tolerated.  °

## 2019-01-24 NOTE — Assessment & Plan Note (Signed)
Check labs Pt is eating enough protein  May be from meds-- she will discuss with cardiology

## 2019-01-25 LAB — THYROID PANEL WITH TSH
Free Thyroxine Index: 3 (ref 1.4–3.8)
T3 Uptake: 32 % (ref 22–35)
T4, Total: 9.4 ug/dL (ref 5.1–11.9)
TSH: 1.01 mIU/L (ref 0.40–4.50)

## 2019-01-31 ENCOUNTER — Other Ambulatory Visit: Payer: Self-pay | Admitting: Family Medicine

## 2019-01-31 DIAGNOSIS — E1165 Type 2 diabetes mellitus with hyperglycemia: Secondary | ICD-10-CM

## 2019-01-31 DIAGNOSIS — E785 Hyperlipidemia, unspecified: Secondary | ICD-10-CM

## 2019-02-01 ENCOUNTER — Other Ambulatory Visit: Payer: Self-pay

## 2019-02-01 MED ORDER — JARDIANCE 25 MG PO TABS: 25.0000 mg | ORAL_TABLET | Freq: Every day | ORAL | 2 refills | Status: DC

## 2019-02-14 ENCOUNTER — Other Ambulatory Visit: Payer: Self-pay | Admitting: Endocrinology

## 2019-02-14 DIAGNOSIS — E049 Nontoxic goiter, unspecified: Secondary | ICD-10-CM

## 2019-02-21 ENCOUNTER — Other Ambulatory Visit: Payer: Medicare Other

## 2019-03-08 ENCOUNTER — Other Ambulatory Visit: Payer: Self-pay | Admitting: Family Medicine

## 2019-03-08 DIAGNOSIS — Z1231 Encounter for screening mammogram for malignant neoplasm of breast: Secondary | ICD-10-CM

## 2019-03-17 ENCOUNTER — Other Ambulatory Visit: Payer: Self-pay | Admitting: Obstetrics and Gynecology

## 2019-03-17 ENCOUNTER — Other Ambulatory Visit: Payer: Self-pay | Admitting: Family Medicine

## 2019-03-17 DIAGNOSIS — N63 Unspecified lump in unspecified breast: Secondary | ICD-10-CM

## 2019-03-29 ENCOUNTER — Ambulatory Visit
Admission: RE | Admit: 2019-03-29 | Discharge: 2019-03-29 | Disposition: A | Discharge status: Home / Self Care | Payer: Medicare Other | Source: Ambulatory Visit | Attending: Obstetrics and Gynecology | Admitting: Obstetrics and Gynecology

## 2019-03-29 ENCOUNTER — Other Ambulatory Visit: Payer: Self-pay

## 2019-03-29 ENCOUNTER — Other Ambulatory Visit: Payer: Self-pay | Admitting: Obstetrics and Gynecology

## 2019-03-29 DIAGNOSIS — N63 Unspecified lump in unspecified breast: Secondary | ICD-10-CM

## 2019-04-19 ENCOUNTER — Ambulatory Visit: Payer: Medicare Other | Attending: Internal Medicine

## 2019-04-19 DIAGNOSIS — Z20822 Contact with and (suspected) exposure to covid-19: Secondary | ICD-10-CM

## 2019-04-20 ENCOUNTER — Other Ambulatory Visit: Payer: Self-pay | Admitting: Family Medicine

## 2019-04-20 DIAGNOSIS — E119 Type 2 diabetes mellitus without complications: Secondary | ICD-10-CM

## 2019-04-20 NOTE — Telephone Encounter (Signed)
Next OV 01/24/19 MED CHANGE to Jardiance 25 mg 02/01/19 Next OV not scheduled

## 2019-04-21 LAB — NOVEL CORONAVIRUS, NAA: SARS-CoV-2, NAA: NOT DETECTED

## 2019-05-05 ENCOUNTER — Telehealth: Payer: Self-pay

## 2019-05-05 NOTE — Telephone Encounter (Signed)
Just an FYI. Pt's husband Kyung Rudd passed away after a 16-17 day stay at [facility] d/t COVID-19. He was Dr. Ethel Rana patient.

## 2019-06-30 ENCOUNTER — Other Ambulatory Visit: Payer: Self-pay | Admitting: Family Medicine

## 2019-06-30 DIAGNOSIS — E119 Type 2 diabetes mellitus without complications: Secondary | ICD-10-CM
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
## 2019-07-28 ENCOUNTER — Encounter: Payer: Self-pay | Admitting: Interventional Cardiology

## 2019-07-28 ENCOUNTER — Ambulatory Visit (INDEPENDENT_AMBULATORY_CARE_PROVIDER_SITE_OTHER): Payer: Medicare Other | Admitting: Interventional Cardiology

## 2019-07-28 ENCOUNTER — Other Ambulatory Visit: Payer: Self-pay

## 2019-07-28 VITALS — BP 126/68 | HR 74 | Ht 67.0 in | Wt 136.6 lb

## 2019-07-28 DIAGNOSIS — I4510 Unspecified right bundle-branch block: Secondary | ICD-10-CM | POA: Diagnosis not present

## 2019-07-28 DIAGNOSIS — E782 Mixed hyperlipidemia: Secondary | ICD-10-CM | POA: Diagnosis not present

## 2019-07-28 DIAGNOSIS — I2510 Atherosclerotic heart disease of native coronary artery without angina pectoris: Secondary | ICD-10-CM | POA: Diagnosis not present

## 2019-07-28 DIAGNOSIS — E118 Type 2 diabetes mellitus with unspecified complications: Secondary | ICD-10-CM | POA: Diagnosis not present

## 2019-07-28 DIAGNOSIS — E1165 Type 2 diabetes mellitus with hyperglycemia: Secondary | ICD-10-CM

## 2019-07-28 DIAGNOSIS — I10 Essential (primary) hypertension: Secondary | ICD-10-CM

## 2019-07-28 DIAGNOSIS — IMO0002 Reserved for concepts with insufficient information to code with codable children: Secondary | ICD-10-CM

## 2019-07-28 DIAGNOSIS — Z7189 Other specified counseling: Secondary | ICD-10-CM

## 2019-07-28 LAB — LIPID PANEL
Chol/HDL Ratio: 2 ratio (ref 0.0–4.4)
Cholesterol, Total: 149 mg/dL (ref 100–199)
HDL: 75 mg/dL (ref >39)
LDL Chol Calc (NIH): 65 mg/dL (ref 0–99)
Triglycerides: 38 mg/dL (ref 0–149)
VLDL Cholesterol Cal: 9 mg/dL (ref 5–40)

## 2019-07-28 LAB — HEPATIC FUNCTION PANEL
ALT: 201 IU/L — ABNORMAL HIGH (ref 0–32)
AST: 136 IU/L — ABNORMAL HIGH (ref 0–40)
Albumin: 4.4 g/dL (ref 3.8–4.8)
Alkaline Phosphatase: 360 IU/L — ABNORMAL HIGH (ref 39–117)
Bilirubin Total: 0.7 mg/dL (ref 0.0–1.2)
Bilirubin, Direct: 0.24 mg/dL (ref 0.00–0.40)
Total Protein: 7.1 g/dL (ref 6.0–8.5)

## 2019-07-28 LAB — BASIC METABOLIC PANEL
BUN/Creatinine Ratio: 20 (ref 12–28)
BUN: 18 mg/dL (ref 8–27)
CO2: 27 mmol/L (ref 20–29)
Calcium: 10.3 mg/dL (ref 8.7–10.3)
Chloride: 99 mmol/L (ref 96–106)
Creatinine, Ser: 0.88 mg/dL (ref 0.57–1.00)
GFR calc Af Amer: 79 mL/min/{1.73_m2} (ref >59)
GFR calc non Af Amer: 69 mL/min/{1.73_m2} (ref >59)
Glucose: 122 mg/dL — ABNORMAL HIGH (ref 65–99)
Potassium: 5.3 mmol/L — ABNORMAL HIGH (ref 3.5–5.2)
Sodium: 140 mmol/L (ref 134–144)

## 2019-07-28 LAB — TSH: TSH: 0.749 u[IU]/mL (ref 0.450–4.500)

## 2019-07-28 MED ORDER — METOPROLOL SUCCINATE ER 25 MG PO TB24: 25.0000 mg | ORAL_TABLET | Freq: Every day | ORAL | 3 refills | Status: DC

## 2019-07-28 NOTE — Patient Instructions (Signed)
Medication Instructions:  1) START Metoprolol Succinate 25mg  once daily 2) DISCONTINUE Metoprolol Tartrate  *If you need a refill on your cardiac medications before your next appointment, please call your pharmacy*   Lab Work: Liver, Lipid, BMET and TSH today  If you have labs (blood work) drawn today and your tests are completely normal, you will receive your results only by: Marland Kitchen MyChart Message (if you have MyChart) OR . A paper copy in the mail If you have any lab test that is abnormal or we need to change your treatment, we will call you to review the results.   Testing/Procedures: None   Follow-Up: At [facility], you and your health needs are our priority.  As part of our continuing mission to provide you with exceptional heart care, we have created designated Provider Care Teams.  These Care Teams include your primary Cardiologist (physician) and Advanced Practice Providers (APPs -  Physician Assistants and Nurse Practitioners) who all work together to provide you with the care you need, when you need it.  We recommend signing up for the patient portal called "MyChart".  Sign up information is provided on this After Visit Summary.  MyChart is used to connect with patients for Virtual Visits (Telemedicine).  Patients are able to view lab/test results, encounter notes, upcoming appointments, etc.  Non-urgent messages can be sent to your provider as well.   To learn more about what you can do with MyChart, go to [email or web address].    Your next appointment:   12 month(s)  The format for your next appointment:   In Person  Provider:   You may see Sinclair Grooms, MD or one of the following Advanced Practice Providers on your designated Care Team:    Truitt Merle, NP  Cecilie Kicks, NP  Kathyrn Drown, NP    Other Instructions

## 2019-07-31 ENCOUNTER — Encounter: Payer: Self-pay | Admitting: Family Medicine

## 2019-07-31 ENCOUNTER — Ambulatory Visit: Payer: Medicare Other | Admitting: Family Medicine

## 2019-07-31 ENCOUNTER — Other Ambulatory Visit: Payer: Self-pay

## 2019-07-31 ENCOUNTER — Ambulatory Visit (INDEPENDENT_AMBULATORY_CARE_PROVIDER_SITE_OTHER): Payer: Medicare Other | Admitting: Family Medicine

## 2019-07-31 ENCOUNTER — Telehealth: Payer: Self-pay

## 2019-07-31 VITALS — BP 110/68 | HR 70 | Temp 97.6°F | Resp 18 | Ht 67.0 in | Wt 135.4 lb

## 2019-07-31 DIAGNOSIS — E1165 Type 2 diabetes mellitus with hyperglycemia: Secondary | ICD-10-CM

## 2019-07-31 DIAGNOSIS — R748 Abnormal levels of other serum enzymes: Secondary | ICD-10-CM | POA: Diagnosis not present

## 2019-07-31 DIAGNOSIS — E782 Mixed hyperlipidemia: Secondary | ICD-10-CM

## 2019-07-31 MED ORDER — BLOOD GLUCOSE MONITOR KIT: PACK | 0 refills | Status: DC

## 2019-07-31 NOTE — Patient Instructions (Signed)
Liver Function Tests Why am I having this test? Liver function tests are done to see how well your liver is working. The proteins and enzymes measured in the test can alert your health care provider to inflammation, damage, or disease in your liver. It is common to have liver function tests:  When you are taking certain medicines.  If you have liver disease.  If you drink a lot of alcohol.  When you are not feeling well.  When you have other conditions that may affect your liver.  During annual physical exams.  If you have symptoms such as yellowing of the skin (jaundice), abdominal pain, or nausea and vomiting. What is being tested? These tests measure various substances in your blood. This may include:  Alanine transaminase (ALT). This is an enzyme in the liver.  Aspartate transaminase (AST). This is an enzyme in the liver, heart, and muscles.  Alkaline phosphatase (ALP). This is a protein in the liver, bile ducts, bone, and other body tissues.  Total bilirubin. This is a yellow pigment in bile.  Albumin. This is a protein in the liver.  Prothrombin time and international normalized ratio (PT and INR). PT measures the time it takes for your blood to clot. INR is a calculation of blood clotting time based on your PT result. It is also calculated based on normal ranges defined by the lab that processed your test.  Total protein. This includes two proteins, albumin and globulin, found in the blood. What kind of sample is taken?  A blood sample is required for this test. It is usually collected by inserting a needle into a blood vessel. How do I prepare for this test? How you prepare will depend on which tests are being done and the reason for doing them. You may need to:  Avoid eating for 4-6 hours before the test, or as told by your health care provider.  Stop taking certain medicines before your blood test, as told by your health care provider. Tell a health care provider  about:  All medicines you are taking, including vitamins, herbs, eye drops, creams, and over-the-counter medicines.  Any medical conditions you have.  Whether you are pregnant or may be pregnant. How are the results reported? Your test results will be reported as values. Your health care provider will compare your results to normal ranges that were established after testing a large group of people (reference ranges). Reference ranges may vary among labs and hospitals. For the substances measured in liver function tests, common reference ranges are: ALT  Infant: 10-40 international units/L.  Child or adult: 4-36 international units/L at 37C or 4-36 units/L (SI units).  Reference ranges may be higher for older adults. AST  Newborn 44-22 days old: 35-140 units/L.  Child younger than 92 years old: 15-60 units/L.  56-71 years old: 15-50 units/L.  97-7 years old: 10-50 units/L.  57-57 years old: 10-40 units/L.  Adult: 0-35 units/L or 0-0.58 microkatals/L (SI units).  Reference ranges may be higher for older adults. ALP  Child younger than 61 years old: 85-235 units/L.  20-66 years old: 65-210 units/L.  11-67 years old: 60-300 units/L.  1-26 years old: 30-200 units/L.  Adult: 30-120 units/L or 0.5-2.0 microkatals/L (SI units).  Reference ranges may be higher for older adults. Total bilirubin  Newborn: 1.0-12.0 mg/dL or 17.1-205 micromoles/L (SI units).  Child or adult: 0.3-1.0 mg/dL or 5.1-17 micromoles/L. Albumin  Premature infant: 3.0-4.2 g/dL.  Newborn: 3.5-5.4 g/dL.  Infant: 4.4-5.4 g/dL.  Child:  4.0-5.9 g/dL.  Adult: 3.5-5.0 g/dL or 35-50 g/L (SI units). PT  11.0-12.5 seconds; 85%-100%. INR  0.8-1.1. Total protein  Premature infant: 4.2-7.6 g/dL.  Newborn: 4.6-7.4 g/dL.  Infant: 6.0-6.7 g/dL.  Child: 6.2-8.0 g/dL.  Adult: 6.4-8.3 g/dL or 64-83 g/L (SI units). What do the results mean? Results that are within the reference ranges are considered  normal. For each substance measured, results outside the reference range can indicate various health issues. ALT  Levels above the normal range may indicate liver disease. AST  Levels above the normal range may indicate liver disease. Sometimes levels also increase after burns, surgery, heart attack, muscle damage, or seizure. ALP  Levels above the normal range may be seen in biliary obstruction, liver diseases, bone disease, thyroid disease, tumors, fractures, leukemia, lymphoma, or several other conditions. People with blood type O or B may show higher levels after a fatty meal.  Levels below the normal range may indicate bone and teeth conditions, malnutrition, protein deficiency, or Wilson's disease. Total bilirubin  Levels above the normal range may indicate problems with the liver, gallbladder, or bile ducts. Albumin  Levels above the normal range may indicate dehydration. They may also be caused by a diet that is high in protein.  Levels below the normal range may indicate kidney disease, liver disease, or malabsorption of nutrients. PT and INR  Levels above the normal range mean that your blood is clotting slower than normal. This may be due to blood disorders, liver disorders, or low levels of vitamin K. Total protein  Levels above the normal range may be due to infection or other diseases.  Levels below the normal range may be due to an immune system disorder, bleeding, burns, kidney disorder, liver disease, trouble absorbing or getting nutrients, or other conditions that affect the intestines. Talk with your health care provider about what your results mean. Questions to ask your health care provider Ask your health care provider, or the department that is doing the test:  When will my results be ready?  How will I get my results?  What are my treatment options?  What other tests do I need?  What are my next steps? Summary  Liver function tests are done to see  how well your liver is working.  These tests measure various proteins and enzymes in your blood. The results can alert your health care provider to inflammation, damage, or disease in your liver.  Talk with your health care provider about what your results mean. This information is not intended to replace advice given to you by your health care provider. Make sure you discuss any questions you have with your health care provider. Document Revised: 11/17/2017 Document Reviewed: 01/12/2017 Elsevier Patient Education  [place].

## 2019-07-31 NOTE — Telephone Encounter (Signed)
The patient has been notified of the result and verbalized understanding.  All questions (if any) were answered.  Pt aware to stop Lipitor and follow up with PCP regarding liver panel.   Labs sent to Dr. Etter Sjogren PCP & f/u liver panel lab appt made for 08/31/19 Wilma Flavin, RN 07/31/2019 8:57 AM
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
## 2019-08-01 ENCOUNTER — Other Ambulatory Visit (HOSPITAL_BASED_OUTPATIENT_CLINIC_OR_DEPARTMENT_OTHER): Payer: Medicare Other

## 2019-08-17 ENCOUNTER — Ambulatory Visit (INDEPENDENT_AMBULATORY_CARE_PROVIDER_SITE_OTHER): Payer: Medicare Other

## 2019-08-17 ENCOUNTER — Other Ambulatory Visit: Payer: Self-pay

## 2019-08-17 DIAGNOSIS — R748 Abnormal levels of other serum enzymes: Secondary | ICD-10-CM

## 2019-08-21 ENCOUNTER — Encounter: Payer: Self-pay | Admitting: Family Medicine

## 2019-08-21 ENCOUNTER — Other Ambulatory Visit: Payer: Self-pay

## 2019-08-21 ENCOUNTER — Other Ambulatory Visit: Payer: Self-pay | Admitting: Family Medicine

## 2019-08-21 ENCOUNTER — Ambulatory Visit (INDEPENDENT_AMBULATORY_CARE_PROVIDER_SITE_OTHER): Payer: Medicare Other | Admitting: Family Medicine

## 2019-08-21 DIAGNOSIS — E785 Hyperlipidemia, unspecified: Secondary | ICD-10-CM | POA: Diagnosis not present

## 2019-08-21 DIAGNOSIS — E1165 Type 2 diabetes mellitus with hyperglycemia: Secondary | ICD-10-CM

## 2019-08-21 DIAGNOSIS — R748 Abnormal levels of other serum enzymes: Secondary | ICD-10-CM

## 2019-08-21 LAB — COMPREHENSIVE METABOLIC PANEL
ALT: 323 U/L — ABNORMAL HIGH (ref 0–35)
AST: 251 U/L — ABNORMAL HIGH (ref 0–37)
Albumin: 4.2 g/dL (ref 3.5–5.2)
Alkaline Phosphatase: 297 U/L — ABNORMAL HIGH (ref 39–117)
BUN: 11 mg/dL (ref 6–23)
CO2: 31 mEq/L (ref 19–32)
Calcium: 9.7 mg/dL (ref 8.4–10.5)
Chloride: 100 mEq/L (ref 96–112)
Creatinine, Ser: 0.85 mg/dL (ref 0.40–1.20)
GFR: 80.74 mL/min (ref >60.00)
Glucose, Bld: 116 mg/dL — ABNORMAL HIGH (ref 70–99)
Potassium: 4.6 mEq/L (ref 3.5–5.1)
Sodium: 137 mEq/L (ref 135–145)
Total Bilirubin: 0.7 mg/dL (ref 0.2–1.2)
Total Protein: 6.7 g/dL (ref 6.0–8.3)

## 2019-08-21 LAB — HEMOGLOBIN A1C: Hgb A1c MFr Bld: 7.2 % — ABNORMAL HIGH (ref 4.6–6.5)

## 2019-08-21 NOTE — Assessment & Plan Note (Signed)
Recheck today See [place] results Will need [facility] referral

## 2019-08-21 NOTE — Patient Instructions (Signed)
Liver Function Tests Why am I having this test? Liver function tests are done to see how well your liver is working. The proteins and enzymes measured in the test can alert your health care provider to inflammation, damage, or disease in your liver. It is common to have liver function tests:  When you are taking certain medicines.  If you have liver disease.  If you drink a lot of alcohol.  When you are not feeling well.  When you have other conditions that may affect your liver.  During annual physical exams.  If you have symptoms such as yellowing of the skin (jaundice), abdominal pain, or nausea and vomiting. What is being tested? These tests measure various substances in your blood. This may include:  Alanine transaminase (ALT). This is an enzyme in the liver.  Aspartate transaminase (AST). This is an enzyme in the liver, heart, and muscles.  Alkaline phosphatase (ALP). This is a protein in the liver, bile ducts, bone, and other body tissues.  Total bilirubin. This is a yellow pigment in bile.  Albumin. This is a protein in the liver.  Prothrombin time and international normalized ratio (PT and INR). PT measures the time it takes for your blood to clot. INR is a calculation of blood clotting time based on your PT result. It is also calculated based on normal ranges defined by the lab that processed your test.  Total protein. This includes two proteins, albumin and globulin, found in the blood. What kind of sample is taken?  A blood sample is required for this test. It is usually collected by inserting a needle into a blood vessel. How do I prepare for this test? How you prepare will depend on which tests are being done and the reason for doing them. You may need to:  Avoid eating for 4-6 hours before the test, or as told by your health care provider.  Stop taking certain medicines before your blood test, as told by your health care provider. Tell a health care provider  about:  All medicines you are taking, including vitamins, herbs, eye drops, creams, and over-the-counter medicines.  Any medical conditions you have.  Whether you are pregnant or may be pregnant. How are the results reported? Your test results will be reported as values. Your health care provider will compare your results to normal ranges that were established after testing a large group of people (reference ranges). Reference ranges may vary among labs and hospitals. For the substances measured in liver function tests, common reference ranges are: ALT  Infant: 10-40 international units/L.  Child or adult: 4-36 international units/L at 37C or 4-36 units/L (SI units).  Reference ranges may be higher for older adults. AST  Newborn 44-22 days old: 35-140 units/L.  Child younger than 92 years old: 15-60 units/L.  56-71 years old: 15-50 units/L.  97-7 years old: 10-50 units/L.  57-57 years old: 10-40 units/L.  Adult: 0-35 units/L or 0-0.58 microkatals/L (SI units).  Reference ranges may be higher for older adults. ALP  Child younger than 61 years old: 85-235 units/L.  20-66 years old: 65-210 units/L.  11-67 years old: 60-300 units/L.  1-26 years old: 30-200 units/L.  Adult: 30-120 units/L or 0.5-2.0 microkatals/L (SI units).  Reference ranges may be higher for older adults. Total bilirubin  Newborn: 1.0-12.0 mg/dL or 17.1-205 micromoles/L (SI units).  Child or adult: 0.3-1.0 mg/dL or 5.1-17 micromoles/L. Albumin  Premature infant: 3.0-4.2 g/dL.  Newborn: 3.5-5.4 g/dL.  Infant: 4.4-5.4 g/dL.  Child:  4.0-5.9 g/dL.  Adult: 3.5-5.0 g/dL or 35-50 g/L (SI units). PT  11.0-12.5 seconds; 85%-100%. INR  0.8-1.1. Total protein  Premature infant: 4.2-7.6 g/dL.  Newborn: 4.6-7.4 g/dL.  Infant: 6.0-6.7 g/dL.  Child: 6.2-8.0 g/dL.  Adult: 6.4-8.3 g/dL or 64-83 g/L (SI units). What do the results mean? Results that are within the reference ranges are considered  normal. For each substance measured, results outside the reference range can indicate various health issues. ALT  Levels above the normal range may indicate liver disease. AST  Levels above the normal range may indicate liver disease. Sometimes levels also increase after burns, surgery, heart attack, muscle damage, or seizure. ALP  Levels above the normal range may be seen in biliary obstruction, liver diseases, bone disease, thyroid disease, tumors, fractures, leukemia, lymphoma, or several other conditions. People with blood type O or B may show higher levels after a fatty meal.  Levels below the normal range may indicate bone and teeth conditions, malnutrition, protein deficiency, or Wilson's disease. Total bilirubin  Levels above the normal range may indicate problems with the liver, gallbladder, or bile ducts. Albumin  Levels above the normal range may indicate dehydration. They may also be caused by a diet that is high in protein.  Levels below the normal range may indicate kidney disease, liver disease, or malabsorption of nutrients. PT and INR  Levels above the normal range mean that your blood is clotting slower than normal. This may be due to blood disorders, liver disorders, or low levels of vitamin K. Total protein  Levels above the normal range may be due to infection or other diseases.  Levels below the normal range may be due to an immune system disorder, bleeding, burns, kidney disorder, liver disease, trouble absorbing or getting nutrients, or other conditions that affect the intestines. Talk with your health care provider about what your results mean. Questions to ask your health care provider Ask your health care provider, or the department that is doing the test:  When will my results be ready?  How will I get my results?  What are my treatment options?  What other tests do I need?  What are my next steps? Summary  Liver function tests are done to see  how well your liver is working.  These tests measure various proteins and enzymes in your blood. The results can alert your health care provider to inflammation, damage, or disease in your liver.  Talk with your health care provider about what your results mean. This information is not intended to replace advice given to you by your health care provider. Make sure you discuss any questions you have with your health care provider. Document Revised: 11/17/2017 Document Reviewed: 01/12/2017 Elsevier Patient Education  [place].
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
## 2019-08-22 ENCOUNTER — Other Ambulatory Visit: Payer: Self-pay | Admitting: Family Medicine

## 2019-08-23 NOTE — Telephone Encounter (Signed)
Spoke with pt regarding Onetouch ultra test strip request. Current med list shows onetouch verio. Pt unsure which strip she uses but states she doesn't need any strips at this time. Denial sent to pharmacy. Pt states she has not been able to get into mychart yet and requested results of liver enzymes. Results given. Pt has consulation with GI on 10/05/19 and she is concerned about waiting that long.  Please advise if appt should be moved up earlier?

## 2019-08-23 NOTE — Telephone Encounter (Signed)
I'm forwarding this to kristie to see if she can help get her in sooner  If we can not get her in sooner we should recheck the labs in 3- 4 weeks  If she develops any abd pain she needs to let us know

## 2019-08-23 NOTE — Telephone Encounter (Signed)
Dr Carollee Herter -- notified pt of below. She denies abdominal pain at present. Still waiting to hear from GI re: earlier appt. Also, please verify when labs should be repeated if pt can't see GI before 6/24? U/S result note said 2 days and below says 3 to 4 weeks?  Please clarify? Thanks!

## 2019-08-24 NOTE — Telephone Encounter (Signed)
Thank you :)

## 2019-08-24 NOTE — Telephone Encounter (Signed)
Dr Carollee Herter -- do we still need to repeat labs and if so please verify when we need to repeat them?

## 2019-08-31 ENCOUNTER — Other Ambulatory Visit: Payer: Self-pay

## 2019-08-31 ENCOUNTER — Other Ambulatory Visit: Payer: Medicare Other | Admitting: *Deleted

## 2019-08-31 LAB — HEPATIC FUNCTION PANEL
ALT: 92 IU/L — ABNORMAL HIGH (ref 0–32)
AST: 35 IU/L (ref 0–40)
Albumin: 4.7 g/dL (ref 3.8–4.8)
Alkaline Phosphatase: 222 IU/L — ABNORMAL HIGH (ref 48–121)
Bilirubin Total: 0.4 mg/dL (ref 0.0–1.2)
Bilirubin, Direct: 0.15 mg/dL (ref 0.00–0.40)
Total Protein: 7.2 g/dL (ref 6.0–8.5)
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
## 2019-09-13 ENCOUNTER — Ambulatory Visit (INDEPENDENT_AMBULATORY_CARE_PROVIDER_SITE_OTHER): Payer: Medicare Other | Admitting: Nurse Practitioner

## 2019-09-13 ENCOUNTER — Other Ambulatory Visit (INDEPENDENT_AMBULATORY_CARE_PROVIDER_SITE_OTHER): Payer: Medicare Other

## 2019-09-13 ENCOUNTER — Encounter: Payer: Self-pay | Admitting: Nurse Practitioner

## 2019-09-13 VITALS — BP 114/62 | HR 68 | Ht 67.0 in | Wt 138.4 lb

## 2019-09-13 DIAGNOSIS — R933 Abnormal findings on diagnostic imaging of other parts of digestive tract: Secondary | ICD-10-CM

## 2019-09-13 DIAGNOSIS — K838 Other specified diseases of biliary tract: Secondary | ICD-10-CM | POA: Diagnosis not present

## 2019-09-13 DIAGNOSIS — R7989 Other specified abnormal findings of blood chemistry: Secondary | ICD-10-CM

## 2019-09-13 LAB — BASIC METABOLIC PANEL
BUN: 13 mg/dL (ref 6–23)
CO2: 33 mEq/L — ABNORMAL HIGH (ref 19–32)
Calcium: 10.1 mg/dL (ref 8.4–10.5)
Chloride: 99 mEq/L (ref 96–112)
Creatinine, Ser: 0.9 mg/dL (ref 0.40–1.20)
GFR: 75.57 mL/min (ref >60.00)
Glucose, Bld: 101 mg/dL — ABNORMAL HIGH (ref 70–99)
Potassium: 4.2 mEq/L (ref 3.5–5.1)
Sodium: 137 mEq/L (ref 135–145)

## 2019-09-13 LAB — GAMMA GT: GGT: 815 U/L — ABNORMAL HIGH (ref 7–51)

## 2019-09-13 LAB — HEPATIC FUNCTION PANEL
ALT: 154 U/L — ABNORMAL HIGH (ref 0–35)
AST: 137 U/L — ABNORMAL HIGH (ref 0–37)
Albumin: 4.7 g/dL (ref 3.5–5.2)
Alkaline Phosphatase: 177 U/L — ABNORMAL HIGH (ref 39–117)
Bilirubin, Direct: 0.1 mg/dL (ref 0.0–0.3)
Total Bilirubin: 0.6 mg/dL (ref 0.2–1.2)
Total Protein: 7.6 g/dL (ref 6.0–8.3)

## 2019-09-13 NOTE — Patient Instructions (Signed)
If you are age 67 or older, your body mass index should be between 23-30. Your Body mass index is 21.68 kg/m. If this is out of the aforementioned range listed, please consider follow up with your Primary Care Provider.  If you are age 29 or younger, your body mass index should be between 19-25. Your Body mass index is 21.68 kg/m. If this is out of the aformentioned range listed, please consider follow up with your Primary Care Provider.   Your provider has requested that you go to the basement level for lab work before leaving today. Press "B" on the elevator. The lab is located at the first door on the left as you exit the elevator.  Due to recent changes in healthcare laws, you may see the results of your imaging and laboratory studies on MyChart before your provider has had a chance to review them.  We understand that in some cases there may be results that are confusing or concerning to you. Not all laboratory results come back in the same time frame and the provider may be waiting for multiple results in order to interpret others.  Please give [place] 48 hours in order for your provider to thoroughly review all the results before contacting the office for clarification of your results.   You have been scheduled for an MRI at [facility], located at [place]. Lawrence Santiago in the [facility]. Your appointment is scheduled on 09/26/19 at 9:00 am. Please arrive 30 minutes prior to your appointment time for registration purposes. Please make certain not to have anything to eat or drink 4 hours prior to your test. In addition, if you have any metal in your body, have a pacemaker or defibrillator, please be sure to let your ordering physician know. This test typically takes 45 minutes to 1 hour to complete. Should you need to reschedule, please call [phone].  You have been scheduled to follow up with Carl Best, NP for October 12, 2019 at 9:00 am. If you find this time/date will work, please  call the office at [phone].

## 2019-09-14 ENCOUNTER — Telehealth: Payer: Self-pay

## 2019-09-14 DIAGNOSIS — R748 Abnormal levels of other serum enzymes: Secondary | ICD-10-CM

## 2019-09-14 NOTE — Telephone Encounter (Signed)
Patient will be returning to the lab in a week to have hepatic panel rechecked.

## 2019-09-14 NOTE — Addendum Note (Signed)
Addended by: Aleatha Borer on: 09/14/2019 12:21 PM   Modules accepted: Orders

## 2019-09-14 NOTE — Progress Notes (Signed)
Abdominal MRI/MRCP order changed to stat. Patient was notified. Repeat hepatic panel in 1 week.

## 2019-09-14 NOTE — Progress Notes (Signed)
Agree with assessment and plan as outlined. Dilated CBD and ? Dilated PD with elevated liver enzymes, agree with MRCP to be done ASAP to further evaluate this, ensure no pancreatic mass / malignancy. Sounds like she has had no pain, choledocholithiasis may be less likely. Given she cannot get in to see her primary GI physician I would work this up with imaging as quickly as we can and it sounds like she may also possibly need EUS / ERCP pending findings. It would be good to repeat her LFTs in 1-2 weeks to continue to trend. If any fevers or pain she needs to contact us in the interim.

## 2019-09-15 ENCOUNTER — Other Ambulatory Visit: Payer: Self-pay

## 2019-09-15 ENCOUNTER — Ambulatory Visit (HOSPITAL_COMMUNITY)
Admission: RE | Admit: 2019-09-15 | Discharge: 2019-09-15 | Disposition: A | Discharge status: Home / Self Care | Payer: Medicare Other | Source: Ambulatory Visit | Attending: Nurse Practitioner | Admitting: Nurse Practitioner

## 2019-09-15 ENCOUNTER — Other Ambulatory Visit: Payer: Self-pay | Admitting: Nurse Practitioner

## 2019-09-15 DIAGNOSIS — R7989 Other specified abnormal findings of blood chemistry: Secondary | ICD-10-CM | POA: Diagnosis present

## 2019-09-15 DIAGNOSIS — K838 Other specified diseases of biliary tract: Secondary | ICD-10-CM | POA: Diagnosis present

## 2019-09-15 DIAGNOSIS — R933 Abnormal findings on diagnostic imaging of other parts of digestive tract: Secondary | ICD-10-CM | POA: Insufficient documentation

## 2019-09-15 MED ORDER — GADOBUTROL 1 MMOL/ML IV SOLN
7.0000 mL | Freq: Once | INTRAVENOUS | Status: AC | PRN
  Administered 2019-09-15: 7 mL via INTRAVENOUS

## 2019-09-18 LAB — HEPATITIS C ANTIBODY
Hepatitis C Ab: NONREACTIVE
SIGNAL TO CUT-OFF: 0.01 (ref <1.00)

## 2019-09-18 LAB — HEPATITIS A ANTIBODY, TOTAL: Hepatitis A AB,Total: REACTIVE — AB

## 2019-09-18 LAB — ANA: Anti Nuclear Antibody (ANA): NEGATIVE

## 2019-09-18 LAB — IGG: IgG (Immunoglobin G), Serum: 1061 mg/dL (ref 600–1540)

## 2019-09-18 LAB — MITOCHONDRIAL ANTIBODIES: Mitochondrial M2 Ab, IgG: <=20 U

## 2019-09-18 LAB — HEPATITIS B SURFACE ANTIGEN: Hepatitis B Surface Ag: NONREACTIVE

## 2019-09-18 LAB — HEPATITIS B SURFACE ANTIBODY,QUALITATIVE: Hep B S Ab: NONREACTIVE

## 2019-09-18 LAB — ANTI-SMOOTH MUSCLE ANTIBODY, IGG: Actin (Smooth Muscle) Antibody (IGG): <20 U (ref <20)

## 2019-09-19 ENCOUNTER — Other Ambulatory Visit: Payer: Self-pay

## 2019-09-19 DIAGNOSIS — K838 Other specified diseases of biliary tract: Secondary | ICD-10-CM

## 2019-09-26 ENCOUNTER — Ambulatory Visit (HOSPITAL_COMMUNITY): Payer: Medicare Other

## 2019-10-05 ENCOUNTER — Ambulatory Visit: Payer: Medicare Other | Admitting: Internal Medicine

## 2019-10-06 ENCOUNTER — Other Ambulatory Visit: Payer: Self-pay | Admitting: Interventional Cardiology

## 2019-10-06 DIAGNOSIS — I2510 Atherosclerotic heart disease of native coronary artery without angina pectoris: Secondary | ICD-10-CM

## 2019-10-06 DIAGNOSIS — I10 Essential (primary) hypertension: Secondary | ICD-10-CM

## 2019-10-09 ENCOUNTER — Other Ambulatory Visit (HOSPITAL_COMMUNITY)
Admission: RE | Admit: 2019-10-09 | Discharge: 2019-10-09 | Disposition: A | Discharge status: Home / Self Care | Payer: Medicare Other | Source: Ambulatory Visit | Attending: Gastroenterology | Admitting: Gastroenterology

## 2019-10-09 DIAGNOSIS — Z01812 Encounter for preprocedural laboratory examination: Secondary | ICD-10-CM | POA: Insufficient documentation

## 2019-10-09 DIAGNOSIS — Z20822 Contact with and (suspected) exposure to covid-19: Secondary | ICD-10-CM | POA: Diagnosis not present

## 2019-10-09 LAB — SARS CORONAVIRUS 2 (TAT 6-24 HRS): SARS Coronavirus 2: NEGATIVE

## 2019-10-12 ENCOUNTER — Other Ambulatory Visit: Payer: Self-pay

## 2019-10-12 ENCOUNTER — Ambulatory Visit (HOSPITAL_COMMUNITY): Payer: Medicare Other | Admitting: Anesthesiology

## 2019-10-12 ENCOUNTER — Ambulatory Visit (HOSPITAL_COMMUNITY)
Admission: RE | Admit: 2019-10-12 | Discharge: 2019-10-12 | Disposition: A | Discharge status: Home / Self Care | Payer: Medicare Other | Attending: Gastroenterology | Admitting: Gastroenterology

## 2019-10-12 ENCOUNTER — Telehealth: Payer: Self-pay

## 2019-10-12 ENCOUNTER — Ambulatory Visit: Payer: Medicare Other | Admitting: Nurse Practitioner

## 2019-10-12 ENCOUNTER — Encounter (HOSPITAL_COMMUNITY): Payer: Self-pay | Admitting: Gastroenterology

## 2019-10-12 ENCOUNTER — Encounter (HOSPITAL_COMMUNITY)
Admission: RE | Disposition: A | Discharge status: Home / Self Care | Payer: Self-pay | Source: Home / Self Care | Attending: Gastroenterology

## 2019-10-12 DIAGNOSIS — R748 Abnormal levels of other serum enzymes: Secondary | ICD-10-CM | POA: Insufficient documentation

## 2019-10-12 DIAGNOSIS — K838 Other specified diseases of biliary tract: Secondary | ICD-10-CM | POA: Diagnosis not present

## 2019-10-12 DIAGNOSIS — J45909 Unspecified asthma, uncomplicated: Secondary | ICD-10-CM | POA: Insufficient documentation

## 2019-10-12 DIAGNOSIS — Z7984 Long term (current) use of oral hypoglycemic drugs: Secondary | ICD-10-CM | POA: Diagnosis not present

## 2019-10-12 DIAGNOSIS — E785 Hyperlipidemia, unspecified: Secondary | ICD-10-CM | POA: Diagnosis not present

## 2019-10-12 DIAGNOSIS — Z7982 Long term (current) use of aspirin: Secondary | ICD-10-CM | POA: Insufficient documentation

## 2019-10-12 DIAGNOSIS — Z79899 Other long term (current) drug therapy: Secondary | ICD-10-CM | POA: Insufficient documentation

## 2019-10-12 DIAGNOSIS — I10 Essential (primary) hypertension: Secondary | ICD-10-CM | POA: Insufficient documentation

## 2019-10-12 DIAGNOSIS — K219 Gastro-esophageal reflux disease without esophagitis: Secondary | ICD-10-CM | POA: Diagnosis not present

## 2019-10-12 DIAGNOSIS — R7989 Other specified abnormal findings of blood chemistry: Secondary | ICD-10-CM | POA: Insufficient documentation

## 2019-10-12 DIAGNOSIS — I2510 Atherosclerotic heart disease of native coronary artery without angina pectoris: Secondary | ICD-10-CM | POA: Insufficient documentation

## 2019-10-12 DIAGNOSIS — E119 Type 2 diabetes mellitus without complications: Secondary | ICD-10-CM | POA: Insufficient documentation

## 2019-10-12 DIAGNOSIS — Z955 Presence of coronary angioplasty implant and graft: Secondary | ICD-10-CM | POA: Diagnosis not present

## 2019-10-12 HISTORY — PX: ESOPHAGOGASTRODUODENOSCOPY (EGD) WITH PROPOFOL: SHX5813

## 2019-10-12 HISTORY — PX: EUS: SHX5427

## 2019-10-12 LAB — GLUCOSE, CAPILLARY: Glucose-Capillary: 86 mg/dL (ref 70–99)

## 2019-10-12 LAB — HEPATIC FUNCTION PANEL
ALT: 93 U/L — ABNORMAL HIGH (ref 0–44)
AST: 90 U/L — ABNORMAL HIGH (ref 15–41)
Albumin: 4 g/dL (ref 3.5–5.0)
Alkaline Phosphatase: 167 U/L — ABNORMAL HIGH (ref 38–126)
Bilirubin, Direct: 0.5 mg/dL — ABNORMAL HIGH (ref 0.0–0.2)
Indirect Bilirubin: 1 mg/dL — ABNORMAL HIGH (ref 0.3–0.9)
Total Bilirubin: 1.5 mg/dL — ABNORMAL HIGH (ref 0.3–1.2)
Total Protein: 6.5 g/dL (ref 6.5–8.1)

## 2019-10-12 SURGERY — UPPER ENDOSCOPIC ULTRASOUND (EUS) RADIAL
Anesthesia: Monitor Anesthesia Care

## 2019-10-12 MED ORDER — ONDANSETRON HCL 4 MG/2ML IJ SOLN
INTRAMUSCULAR | Status: DC | PRN
  Administered 2019-10-12: 4 mg via INTRAVENOUS

## 2019-10-12 MED ORDER — SODIUM CHLORIDE 0.9 % IV SOLN: INTRAVENOUS | Status: DC

## 2019-10-12 MED ORDER — LIDOCAINE 2% (20 MG/ML) 5 ML SYRINGE
INTRAMUSCULAR | Status: DC | PRN
  Administered 2019-10-12: 20 mg via INTRAVENOUS
  Administered 2019-10-12: 80 mg via INTRAVENOUS

## 2019-10-12 MED ORDER — PROPOFOL 500 MG/50ML IV EMUL
INTRAVENOUS | Status: AC
  Filled 2019-10-12: qty 50

## 2019-10-12 MED ORDER — PROPOFOL 500 MG/50ML IV EMUL
INTRAVENOUS | Status: DC | PRN
  Administered 2019-10-12 (×2): 20 mg via INTRAVENOUS
  Administered 2019-10-12: 200 ug/kg/min via INTRAVENOUS
  Administered 2019-10-12: 30 mg via INTRAVENOUS

## 2019-10-12 MED ORDER — PHENYLEPHRINE 40 MCG/ML (10ML) SYRINGE FOR IV PUSH (FOR BLOOD PRESSURE SUPPORT)
PREFILLED_SYRINGE | INTRAVENOUS | Status: DC | PRN
  Administered 2019-10-12: 80 ug via INTRAVENOUS

## 2019-10-12 MED ORDER — LACTATED RINGERS IV SOLN: INTRAVENOUS | Status: DC | PRN

## 2019-10-12 SURGICAL SUPPLY — 15 items

## 2019-10-12 NOTE — Anesthesia Preprocedure Evaluation (Addendum)
Anesthesia Evaluation  Patient identified by MRN, date of birth, ID band Patient awake    Reviewed: Allergy & Precautions, NPO status , Patient's Chart, lab work & pertinent test results, reviewed documented beta blocker date and time   Airway Mallampati: II  TM Distance: >3 FB Neck ROM: Full    Dental no notable dental hx. (+) Teeth Intact   Pulmonary asthma ,    Pulmonary exam normal breath sounds clear to auscultation       Cardiovascular hypertension, Pt. on medications + angina with exertion + CAD and + Cardiac Stents  Normal cardiovascular exam Rhythm:Regular Rate:Normal  E#KG 07/28/19 NSR, LAD, RBBB pattern  Echo 08/07/15 Left ventricle: The cavity size was normal. Systolic function was normal. The estimated ejection fraction was in the range of 55% to 60%. Wall motion was normal; there were no regional wall motion abnormalities. Doppler parameters are consistent with abnormal left ventricular relaxation (grade 1 diastolic dysfunction).  - Aortic valve: There was mild regurgitation.  - Mitral valve: Mild prolapse, involving the anterior leaflet.  There was systolic anterior motion of the chordal structures. There was mild regurgitation.   Myocardial perfusion scan 02/22/17  The left ventricular ejection fraction is hyperdynamic (>65%).  Nuclear stress EF: 77%.  Blood pressure demonstrated a normal response to exercise.  There was no ST segment deviation noted during stress.  The study is normal.  This is a low risk study.  Cardiac Cath 09/04/15  Prox RCA to Mid RCA lesion, 50% stenosed.  Post Atrio lesion, 65% stenosed.  Ost LAD to Prox LAD lesion, 90% stenosed.  Ost 1st Diag to 1st Diag lesion, 99% stenosed.  Dist LAD lesion, 20% stenosed.  The left ventricular systolic function is normal.   1. Severe stenosis proximal LAD, now s/p DES x 1 proximal LAD 2. PTCA/balloon angioplasty only ostium  Diagonal 3. Moderate stenosis mid RCA. This does not appear to be flow limiting. Moderate stenosis ostium right posterolateral branch.  4. Normal LV systolic function.     Neuro/Psych  Headaches, negative psych ROS   GI/Hepatic GERD  Medicated and Controlled,Elevated LFT's Dillated CBD   Endo/Other  diabetes, Well Controlled  Renal/GU negative Renal ROS  negative genitourinary   Musculoskeletal negative musculoskeletal ROS (+)   Abdominal   Peds  Hematology Plavix therapy- last dose   Anesthesia Other Findings   Reproductive/Obstetrics                            Anesthesia Physical Anesthesia Plan  ASA: III  Anesthesia Plan: MAC   Post-op Pain Management:    Induction:   PONV Risk Score and Plan: 3 and Ondansetron, Treatment may vary due to age or medical condition and Propofol infusion  Airway Management Planned: Natural Airway and Nasal Cannula  Additional Equipment:   Intra-op Plan:   Post-operative Plan:   Informed Consent: I have reviewed the patients History and Physical, chart, labs and discussed the procedure including the risks, benefits and alternatives for the proposed anesthesia with the patient or authorized representative who has indicated his/her understanding and acceptance.     Dental advisory given  Plan Discussed with: CRNA  Anesthesia Plan Comments:         Anesthesia Quick Evaluation

## 2019-10-12 NOTE — Transfer of Care (Signed)
Immediate Anesthesia Transfer of Care Note  Patient: Christine Walker  Procedure(s) Performed: Procedure(s): UPPER ENDOSCOPIC ULTRASOUND (EUS) RADIAL (N/A) ESOPHAGOGASTRODUODENOSCOPY (EGD) WITH PROPOFOL (N/A)  Patient Location: PACU  Anesthesia Type:MAC  Level of Consciousness: Patient easily awoken, sedated, comfortable, cooperative, following commands, responds to stimulation.   Airway & Oxygen Therapy: Patient spontaneously breathing, ventilating well, oxygen via simple oxygen mask.  Post-op Assessment: Report given to PACU RN, vital signs reviewed and stable, moving all extremities.   Post vital signs: Reviewed and stable.  Complications: No apparent anesthesia complications  Last Vitals:  Vitals Value Taken Time  BP 99/52 10/12/19 1030  Temp 36.6 C 10/12/19 1030  Pulse 59 10/12/19 1031  Resp 12 10/12/19 1031  SpO2 100 % 10/12/19 1031  Vitals shown include unvalidated device data.  Last Pain:  Vitals:   10/12/19 1030  TempSrc: Axillary  PainSc:          Complications: No complications documented.

## 2019-10-12 NOTE — Telephone Encounter (Signed)
CT scan scheduled at 8 am on 7/9 at [facility] nothing to eat or drink after midnight.  Arrive at 715 am to drink prep.   ERCP 10/19/19 at 115 pm at [facility] [place] test on 7/6 at 120 pm  The patient has been notified of this information and all questions answered. The pt read back the instructions verbalizing understanding.

## 2019-10-12 NOTE — Anesthesia Procedure Notes (Signed)
Procedure Name: MAC Date/Time: 10/12/2019 9:30 AM Performed by: Deliah Boston, CRNA Pre-anesthesia Checklist: Patient identified, Emergency Drugs available, Suction available and Patient being monitored Patient Re-evaluated:Patient Re-evaluated prior to induction Oxygen Delivery Method: Simple face mask Preoxygenation: Pre-oxygenation with 100% oxygen Induction Type: IV induction Placement Confirmation: positive ETCO2 and breath sounds checked- equal and bilateral Dental Injury: Teeth and Oropharynx as per pre-operative assessment

## 2019-10-12 NOTE — Telephone Encounter (Signed)
Yes, Christine Walker at [facility] moved some of my cases a bit earlier to make room.  Thanks

## 2019-10-12 NOTE — Telephone Encounter (Signed)
-----   Message from [name], MD sent at 10/12/2019 10:57 AM EDT ----- Just completed EUS.  She needs pancreatic protocol CT scan with IV and oral contrast. I'm suspicious of distal CBD growth.  Can you also put her on for ERCP for next Thursday at end of my current day schedule. For dilated bile duct, elevated liver tests.  Thanks

## 2019-10-12 NOTE — Telephone Encounter (Signed)
Dr Ardis Hughs are you talking about 10/19/19 for this ERCP?

## 2019-10-12 NOTE — Discharge Instructions (Signed)
YOU HAD AN ENDOSCOPIC PROCEDURE TODAY: Refer to the procedure report and other information in the discharge instructions given to you for any specific questions about what was found during the examination. If this information does not answer your questions, please call [facility] office at [phone] to clarify.  ° °YOU SHOULD EXPECT: Some feelings of bloating in the abdomen. Passage of more gas than usual. Walking can help get rid of the air that was put into your GI tract during the procedure and reduce the bloating. If you had a lower endoscopy (such as a colonoscopy or flexible sigmoidoscopy) you may notice spotting of blood in your stool or on the toilet paper. Some abdominal soreness may be present for a day or two, also. ° °DIET: Your first meal following the procedure should be a light meal and then it is ok to progress to your normal diet. A half-sandwich or bowl of soup is an example of a good first meal. Heavy or fried foods are harder to digest and may make you feel nauseous or bloated. Drink plenty of fluids but you should avoid alcoholic beverages for 24 hours. If you had a esophageal dilation, please see attached instructions for diet.   ° °ACTIVITY: Your care partner should take you home directly after the procedure. You should plan to take it easy, moving slowly for the rest of the day. You can resume normal activity the day after the procedure however YOU SHOULD NOT DRIVE, use power tools, machinery or perform tasks that involve climbing or major physical exertion for 24 hours (because of the sedation medicines used during the test).  ° °SYMPTOMS TO REPORT IMMEDIATELY: °A gastroenterologist can be reached at any hour. Please call [phone]  for any of the following symptoms:  °Following lower endoscopy (colonoscopy, flexible sigmoidoscopy) °Excessive amounts of blood in the stool  °Significant tenderness, worsening of abdominal pains  °Swelling of the abdomen that is new, acute  °Fever of 100° or  higher  °Following upper endoscopy (EGD, EUS, ERCP, esophageal dilation) °Vomiting of blood or coffee ground material  °New, significant abdominal pain  °New, significant chest pain or pain under the shoulder blades  °Painful or persistently difficult swallowing  °New shortness of breath  °Black, tarry-looking or red, bloody stools ° °FOLLOW UP:  °If any biopsies were taken you will be contacted by phone or by letter within the next 1-3 weeks. Call [phone]  if you have not heard about the biopsies in 3 weeks.  °Please also call with any specific questions about appointments or follow up tests. ° °

## 2019-10-12 NOTE — Interval H&P Note (Signed)
History and Physical Interval Note:  10/12/2019 9:01 AM  Christine Walker  has presented today for surgery, with the diagnosis of dilated bile duct.  The various methods of treatment have been discussed with the patient and family. After consideration of risks, benefits and other options for treatment, the patient has consented to  Procedure(s): UPPER ENDOSCOPIC ULTRASOUND (EUS) RADIAL (N/A) ESOPHAGOGASTRODUODENOSCOPY (EGD) WITH PROPOFOL (N/A) as a surgical intervention.  The patient's history has been reviewed, patient examined, no change in status, stable for surgery.  I have reviewed the patient's chart and labs.  Questions were answered to the patient's satisfaction.     [name]

## 2019-10-12 NOTE — Op Note (Addendum)
[facility] Patient Name: Christine Walker Procedure Date: 10/12/2019 MRN: [id number] Attending MD: [name] , MD Date of Birth: [birth year] CSN: [id number] Age: 69 Admit Type: Outpatient Procedure:                Upper EUS Indications:              elevated liver tests (a bit improved as of early                            09/2019 labs) , mild weight loss, dilated CBD and PD                            on recent MRI, cyst in uncinate pancreas Providers:                [name], MD, Benetta Spar RN, RN, Lazaro Arms, Technician Referring MD:              Medicines:                Monitored Anesthesia Care Complications:            No immediate complications. Estimated blood loss:                            None. Estimated Blood Loss:     Estimated blood loss: none. Procedure:                Pre-Anesthesia Assessment:                           - Prior to the procedure, a History and Physical                            was performed, and patient medications and                            allergies were reviewed. The patient's tolerance of                            previous anesthesia was also reviewed. The risks                            and benefits of the procedure and the sedation                            options and risks were discussed with the patient.                            All questions were answered, and informed consent                            was obtained. Prior Anticoagulants: The patient has  taken no previous anticoagulant or antiplatelet                            agents. ASA Grade Assessment: II - A patient with                            mild systemic disease. After reviewing the risks                            and benefits, the patient was deemed in                            satisfactory condition to undergo the procedure.                           After obtaining informed  consent, the endoscope was                            passed under direct vision. Throughout the                            procedure, the patient's blood pressure, pulse, and                            oxygen saturations were monitored continuously. The                            [id number]-[id number] ([id number]) Olympus Radial EUS was                            introduced through the mouth, and advanced to the                            second part of duodenum. The [id number] ([id number])                            [place] was introduced through the                            mouth, and advanced to the second part of duodenum.                            The [id number] ([id number]) Olympus Linear EUS was                            introduced through the mouth, and advanced to the                            second part of duodenum. The upper EUS was                            accomplished without difficulty. The patient  tolerated the procedure well. Scope In: Scope Out: Findings:      ENDOSCOPIC FINDING: :      The examined esophagus was endoscopically normal.      The entire examined stomach was endoscopically normal.      The major papilla was somewhat bulging but the mucosa was normal       appearing. There was intermittent dark, old bile draining from the major       papilla.      ENDOSONOGRAPHIC FINDING: :      1. The CBD/CHD were diffusely dilated (cbd 32mm) into the periampullary       region. There appeared to be a hypoechoic, heterogeneous 1cm soft tissue       lesion within the distal CBD.      2. The main pancreatic duct was dilated to 6-79mm in the head of the       pancreas, tapering to 2-29mm in the body and tail.      3. Pancreatic parenchyma was normal throughout. I favor the soft tissue       lesion described above to be within the bile duct, not within the       adjacent pancreatic parenchyma.      4. 1.9cm cystic, septated lesion in the uncinate  pancreas without       obvious associated solid mass or mural nodularity. This cyst does not       abut the bile duct but may communicate with uncinate branch of the PD.      5. No peripancreatic adenopathy.      6. Limited views of the gallbladder, liver, spleen, portal and splenic       vessels were all normal. Impression:               - There appears to be a 1cm soft tissue lesion                            within the distal CBD causing a bulbous                            periampulla, diffuse biliary dilation and possibly                            dilation of the main pancreatic duct within the                            head, uncinate pancreas.                           - 1.9cm uncinate pancreatic cyst without concerning                            morphologic features. This is probably an                            incidental finding, unrelated to the biliary                            dilation  We need more information about the bile duct,                            likely distal bile duct soft tissue lesion. My                            office is going to arrange pancreatic protocol CT                            scan as well as tentatively book for ERCP for next                            week. Rechecking LFTs today in recovery. Moderate Sedation:      Not Applicable - Patient had care per Anesthesia. Recommendation:           - As above Procedure Code(s):        --- Professional ---                           [phone], Esophagogastroduodenoscopy, flexible,                            transoral; with endoscopic ultrasound examination                            limited to the esophagus, stomach or duodenum, and                            adjacent structures Diagnosis Code(s):        --- Professional ---                           K83.8, Other specified diseases of biliary tract CPT copyright 2019 American Medical Association. All rights reserved. The codes  documented in this report are preliminary and upon coder review may  be revised to meet current compliance requirements. [name], MD 10/12/2019 10:56:15 AM This report has been signed electronically. Number of Addenda: 0

## 2019-10-12 NOTE — Anesthesia Postprocedure Evaluation (Signed)
Anesthesia Post Note  Patient: AMYLAH WILL  Procedure(s) Performed: UPPER ENDOSCOPIC ULTRASOUND (EUS) RADIAL (N/A ) ESOPHAGOGASTRODUODENOSCOPY (EGD) WITH PROPOFOL (N/A )     Patient location during evaluation: PACU Anesthesia Type: MAC Level of consciousness: awake and alert and oriented Pain management: pain level controlled Vital Signs Assessment: post-procedure vital signs reviewed and stable Respiratory status: spontaneous breathing, nonlabored ventilation and respiratory function stable Cardiovascular status: stable and blood pressure returned to baseline Postop Assessment: no apparent nausea or vomiting Anesthetic complications: no   No complications documented.  Last Vitals:  Vitals:   10/12/19 1030 10/12/19 1040  BP: (!) 99/52 120/67  Pulse: (!) 58   Resp: 12 13  Temp: 36.6 C   SpO2: 100%     Last Pain:  Vitals:   10/12/19 1030  TempSrc: Axillary  PainSc:                  Aleena Kirkeby A.

## 2019-10-17 ENCOUNTER — Other Ambulatory Visit (HOSPITAL_COMMUNITY)
Admission: RE | Admit: 2019-10-17 | Discharge: 2019-10-17 | Disposition: A | Discharge status: Home / Self Care | Payer: Medicare Other | Source: Ambulatory Visit | Attending: Gastroenterology | Admitting: Gastroenterology

## 2019-10-17 ENCOUNTER — Encounter (HOSPITAL_COMMUNITY): Payer: Self-pay | Admitting: Gastroenterology

## 2019-10-17 DIAGNOSIS — Z20822 Contact with and (suspected) exposure to covid-19: Secondary | ICD-10-CM | POA: Insufficient documentation

## 2019-10-17 DIAGNOSIS — Z01812 Encounter for preprocedural laboratory examination: Secondary | ICD-10-CM | POA: Diagnosis present

## 2019-10-17 LAB — SARS CORONAVIRUS 2 (TAT 6-24 HRS): SARS Coronavirus 2: NEGATIVE

## 2019-10-19 ENCOUNTER — Ambulatory Visit (HOSPITAL_COMMUNITY): Payer: Medicare Other | Admitting: Anesthesiology

## 2019-10-19 ENCOUNTER — Ambulatory Visit (HOSPITAL_COMMUNITY)
Admission: RE | Admit: 2019-10-19 | Discharge: 2019-10-19 | Disposition: A | Discharge status: Home / Self Care | Payer: Medicare Other | Attending: Gastroenterology | Admitting: Gastroenterology

## 2019-10-19 ENCOUNTER — Encounter (HOSPITAL_COMMUNITY): Payer: Self-pay | Admitting: Gastroenterology

## 2019-10-19 ENCOUNTER — Other Ambulatory Visit: Payer: Self-pay

## 2019-10-19 ENCOUNTER — Encounter (HOSPITAL_COMMUNITY)
Admission: RE | Disposition: A | Discharge status: Home / Self Care | Payer: Self-pay | Source: Home / Self Care | Attending: Gastroenterology

## 2019-10-19 ENCOUNTER — Ambulatory Visit (HOSPITAL_COMMUNITY): Payer: Medicare Other

## 2019-10-19 DIAGNOSIS — Z8349 Family history of other endocrine, nutritional and metabolic diseases: Secondary | ICD-10-CM | POA: Diagnosis not present

## 2019-10-19 DIAGNOSIS — K838 Other specified diseases of biliary tract: Secondary | ICD-10-CM

## 2019-10-19 DIAGNOSIS — G43909 Migraine, unspecified, not intractable, without status migrainosus: Secondary | ICD-10-CM | POA: Insufficient documentation

## 2019-10-19 DIAGNOSIS — Z833 Family history of diabetes mellitus: Secondary | ICD-10-CM | POA: Insufficient documentation

## 2019-10-19 DIAGNOSIS — K8050 Calculus of bile duct without cholangitis or cholecystitis without obstruction: Secondary | ICD-10-CM

## 2019-10-19 DIAGNOSIS — C7A8 Other malignant neuroendocrine tumors: Secondary | ICD-10-CM | POA: Diagnosis not present

## 2019-10-19 DIAGNOSIS — Z955 Presence of coronary angioplasty implant and graft: Secondary | ICD-10-CM | POA: Diagnosis not present

## 2019-10-19 DIAGNOSIS — K219 Gastro-esophageal reflux disease without esophagitis: Secondary | ICD-10-CM | POA: Diagnosis not present

## 2019-10-19 DIAGNOSIS — K8020 Calculus of gallbladder without cholecystitis without obstruction: Secondary | ICD-10-CM

## 2019-10-19 DIAGNOSIS — I10 Essential (primary) hypertension: Secondary | ICD-10-CM | POA: Insufficient documentation

## 2019-10-19 DIAGNOSIS — Z820 Family history of epilepsy and other diseases of the nervous system: Secondary | ICD-10-CM | POA: Diagnosis not present

## 2019-10-19 DIAGNOSIS — R748 Abnormal levels of other serum enzymes: Secondary | ICD-10-CM

## 2019-10-19 DIAGNOSIS — I341 Nonrheumatic mitral (valve) prolapse: Secondary | ICD-10-CM | POA: Insufficient documentation

## 2019-10-19 DIAGNOSIS — Z8249 Family history of ischemic heart disease and other diseases of the circulatory system: Secondary | ICD-10-CM | POA: Diagnosis not present

## 2019-10-19 DIAGNOSIS — Z8051 Family history of malignant neoplasm of kidney: Secondary | ICD-10-CM | POA: Insufficient documentation

## 2019-10-19 DIAGNOSIS — Z8489 Family history of other specified conditions: Secondary | ICD-10-CM | POA: Diagnosis not present

## 2019-10-19 DIAGNOSIS — R1900 Intra-abdominal and pelvic swelling, mass and lump, unspecified site: Secondary | ICD-10-CM | POA: Diagnosis present

## 2019-10-19 DIAGNOSIS — I4510 Unspecified right bundle-branch block: Secondary | ICD-10-CM | POA: Diagnosis not present

## 2019-10-19 DIAGNOSIS — I25110 Atherosclerotic heart disease of native coronary artery with unstable angina pectoris: Secondary | ICD-10-CM | POA: Diagnosis not present

## 2019-10-19 DIAGNOSIS — J45909 Unspecified asthma, uncomplicated: Secondary | ICD-10-CM | POA: Insufficient documentation

## 2019-10-19 DIAGNOSIS — E785 Hyperlipidemia, unspecified: Secondary | ICD-10-CM | POA: Diagnosis not present

## 2019-10-19 DIAGNOSIS — E049 Nontoxic goiter, unspecified: Secondary | ICD-10-CM | POA: Diagnosis not present

## 2019-10-19 DIAGNOSIS — Z86010 Personal history of colonic polyps: Secondary | ICD-10-CM | POA: Diagnosis not present

## 2019-10-19 DIAGNOSIS — Z823 Family history of stroke: Secondary | ICD-10-CM | POA: Insufficient documentation

## 2019-10-19 HISTORY — PX: BIOPSY: SHX5522

## 2019-10-19 HISTORY — PX: ENDOSCOPIC RETROGRADE CHOLANGIOPANCREATOGRAPHY (ERCP) WITH PROPOFOL: SHX5810

## 2019-10-19 HISTORY — PX: SPHINCTEROTOMY: SHX5544

## 2019-10-19 HISTORY — PX: REMOVAL OF STONES: SHX5545

## 2019-10-19 LAB — HEPATIC FUNCTION PANEL
ALT: 36 U/L (ref 0–44)
AST: 28 U/L (ref 15–41)
Albumin: 4.6 g/dL (ref 3.5–5.0)
Alkaline Phosphatase: 118 U/L (ref 38–126)
Bilirubin, Direct: 0.1 mg/dL (ref 0.0–0.2)
Indirect Bilirubin: 0.7 mg/dL (ref 0.3–0.9)
Total Bilirubin: 0.8 mg/dL (ref 0.3–1.2)
Total Protein: 7.4 g/dL (ref 6.5–8.1)

## 2019-10-19 LAB — GLUCOSE, CAPILLARY: Glucose-Capillary: 92 mg/dL (ref 70–99)

## 2019-10-19 SURGERY — ENDOSCOPIC RETROGRADE CHOLANGIOPANCREATOGRAPHY (ERCP) WITH PROPOFOL
Anesthesia: General

## 2019-10-19 MED ORDER — EPHEDRINE SULFATE-NACL 50-0.9 MG/10ML-% IV SOSY
PREFILLED_SYRINGE | INTRAVENOUS | Status: DC | PRN
  Administered 2019-10-19 (×2): 10 mg via INTRAVENOUS

## 2019-10-19 MED ORDER — INDOMETHACIN 50 MG RE SUPP
RECTAL | Status: AC
  Filled 2019-10-19: qty 2

## 2019-10-19 MED ORDER — FENTANYL CITRATE (PF) 100 MCG/2ML IJ SOLN
INTRAMUSCULAR | Status: DC | PRN
  Administered 2019-10-19 (×2): 50 ug via INTRAVENOUS

## 2019-10-19 MED ORDER — PHENYLEPHRINE 40 MCG/ML (10ML) SYRINGE FOR IV PUSH (FOR BLOOD PRESSURE SUPPORT)
PREFILLED_SYRINGE | INTRAVENOUS | Status: DC | PRN
  Administered 2019-10-19 (×4): 80 ug via INTRAVENOUS

## 2019-10-19 MED ORDER — SUGAMMADEX SODIUM 500 MG/5ML IV SOLN
INTRAVENOUS | Status: DC | PRN
  Administered 2019-10-19: 250 mg via INTRAVENOUS

## 2019-10-19 MED ORDER — HYDROMORPHONE HCL 1 MG/ML IJ SOLN: 0.2500 mg | INTRAMUSCULAR | Status: DC | PRN

## 2019-10-19 MED ORDER — DEXAMETHASONE SODIUM PHOSPHATE 10 MG/ML IJ SOLN
INTRAMUSCULAR | Status: DC | PRN
  Administered 2019-10-19: 10 mg via INTRAVENOUS

## 2019-10-19 MED ORDER — SODIUM CHLORIDE 0.9 % IV SOLN
INTRAVENOUS | Status: DC | PRN
  Administered 2019-10-19: 45 mL

## 2019-10-19 MED ORDER — MEPERIDINE HCL 25 MG/ML IJ SOLN: 6.2500 mg | INTRAMUSCULAR | Status: DC | PRN

## 2019-10-19 MED ORDER — MIDAZOLAM HCL 5 MG/5ML IJ SOLN
INTRAMUSCULAR | Status: DC | PRN
  Administered 2019-10-19: 2 mg via INTRAVENOUS

## 2019-10-19 MED ORDER — LACTATED RINGERS IV SOLN: INTRAVENOUS | Status: DC | PRN

## 2019-10-19 MED ORDER — PROPOFOL 10 MG/ML IV BOLUS
INTRAVENOUS | Status: DC | PRN
  Administered 2019-10-19: 160 mg via INTRAVENOUS

## 2019-10-19 MED ORDER — LIDOCAINE 2% (20 MG/ML) 5 ML SYRINGE
INTRAMUSCULAR | Status: DC | PRN
  Administered 2019-10-19: 100 mg via INTRAVENOUS

## 2019-10-19 MED ORDER — FENTANYL CITRATE (PF) 100 MCG/2ML IJ SOLN
INTRAMUSCULAR | Status: AC
  Filled 2019-10-19: qty 2

## 2019-10-19 MED ORDER — SODIUM CHLORIDE 0.9 % IV SOLN: INTRAVENOUS | Status: DC

## 2019-10-19 MED ORDER — INDOMETHACIN 50 MG RE SUPP
RECTAL | Status: DC | PRN
  Administered 2019-10-19: 100 mg via RECTAL

## 2019-10-19 MED ORDER — MIDAZOLAM HCL 2 MG/2ML IJ SOLN
INTRAMUSCULAR | Status: AC
  Filled 2019-10-19: qty 2

## 2019-10-19 MED ORDER — ROCURONIUM BROMIDE 10 MG/ML (PF) SYRINGE
PREFILLED_SYRINGE | INTRAVENOUS | Status: DC | PRN
  Administered 2019-10-19: 80 mg via INTRAVENOUS

## 2019-10-19 MED ORDER — ONDANSETRON HCL 4 MG/2ML IJ SOLN
4.0000 mg | Freq: Once | INTRAMUSCULAR | Status: AC | PRN
  Administered 2019-10-19: 4 mg via INTRAVENOUS
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
## 2019-10-19 NOTE — Anesthesia Preprocedure Evaluation (Signed)
Anesthesia Evaluation  Patient identified by MRN, date of birth, ID band Patient awake    Reviewed: Allergy & Precautions, NPO status , Patient's Chart, lab work & pertinent test results  Airway Mallampati: I  TM Distance: >3 FB Neck ROM: Full    Dental   Pulmonary    Pulmonary exam normal        Cardiovascular hypertension, Pt. on medications + CAD and + Cardiac Stents  Normal cardiovascular exam     Neuro/Psych    GI/Hepatic GERD  ,  Endo/Other    Renal/GU      Musculoskeletal   Abdominal   Peds  Hematology   Anesthesia Other Findings   Reproductive/Obstetrics                             Anesthesia Physical Anesthesia Plan  ASA: III  Anesthesia Plan: General   Post-op Pain Management:    Induction: Intravenous  PONV Risk Score and Plan: 3  Airway Management Planned: Oral ETT  Additional Equipment:   Intra-op Plan:   Post-operative Plan: Extubation in OR  Informed Consent: I have reviewed the patients History and Physical, chart, labs and discussed the procedure including the risks, benefits and alternatives for the proposed anesthesia with the patient or authorized representative who has indicated his/her understanding and acceptance.       Plan Discussed with: CRNA and Surgeon  Anesthesia Plan Comments:         Anesthesia Quick Evaluation

## 2019-10-19 NOTE — Op Note (Signed)
[facility] Patient Name: Damaya Channing Procedure Date: 10/19/2019 MRN: [id number] Attending MD: [name] , MD Date of Birth: 05-14-1950 CSN: [id number] Age: 69 Admit Type: Outpatient Procedure:                ERCP Indications:              elevated liver tests, dilated bile duct on MRI; EUS                            last week suggested soft tissue mass in the distal                            bile duct. LFTs this morning were normal. Providers:                [name], MD, Benetta Spar RN, RN, Laverda Sorenson, Technician, Corie Chiquito, Technician, [facility], CRNA Referring MD:              Medicines:                General Anesthesia, Indomethacin 419 mg PR Complications:            No immediate complications. Estimated blood loss:                            None Estimated Blood Loss:     Estimated blood loss: none. Procedure:                Pre-Anesthesia Assessment:                           - Prior to the procedure, a History and Physical                            was performed, and patient medications and                            allergies were reviewed. The patient's tolerance of                            previous anesthesia was also reviewed. The risks                            and benefits of the procedure and the sedation                            options and risks were discussed with the patient.                            All questions were answered, and informed consent  was obtained. Prior Anticoagulants: The patient has                            taken no previous anticoagulant or antiplatelet                            agents. ASA Grade Assessment: II - A patient with                            mild systemic disease. After reviewing the risks                            and benefits, the patient was deemed in                             satisfactory condition to undergo the procedure.                           After obtaining informed consent, the scope was                            passed under direct vision. Throughout the                            procedure, the patient's blood pressure, pulse, and                            oxygen saturations were monitored continuously. The                            TJF-Q180V ([id number]) Olympus Duodenoscope was                            introduced through the mouth, and used to inject                            contrast into and used for direct visualization of                            the bile duct. The ERCP was accomplished without                            difficulty. The patient tolerated the procedure                            well. Scope In: Scope Out: Findings:      The scout film was normal. I advanced a duodenoscope to the region of       the major papilla without detailed examination of the UGI tract. The       major papilla was bulging. I used a 87 Autotome over a .035 hydrawire to       cannulate the bile duct after several initial unsuccessful attempts with       that same sphincterotome as well as a 39 Jagtome  over a .025 wire. Given       the technically difficult biliary cannulation I attempted to place a 4Fr       3cm long pigtail stent into the main pancreatic duct however the I could       not seat the pancreatic wire deeply enough to allow for the stent to be       placed. Significant angulation (in long position) also hindered PD stent       placement. Cholangiogram revealed diffuse dilation of the CBD, CHD. The       CBD was 25mm and CHD 65mm. There was an abrupt cutoff of the distal CBD       just above the level of the major papilla. I was suspicious of a distal       bile duct soft tissue lesion and performed a biliary sphincterotomy to       expose the distal duct. The mucosa that I exposed did seem fairly firm       and unusual appearing. I used a  12-30mm retrieval balloon to probe the       distal duct and then sampled the area with biopsy forceps. Given her       normal LFTs this morning I did not place a bile duct stent. The main       pancreatic duct was brifely injected with dye and cannulated with a wire. Impression:               - Dilated CBD/CHD with abrupt cutoff just above the                            major papilla. I am suspicious of a distal CBD soft                            tissue lesion however ampullary stenosis may give a                            similar appearance. I performed a biliary                            sphinctertomy and biopsied what appeared to be                            unusual, somewhat firm distal bile duct mucosa.                           - Attempted pancreatic duct stent placement was not                            successful. Moderate Sedation:      Not Applicable - Patient had care per Anesthesia. Recommendation:           - Discharge patient to home.                           - Await final pathology results. Procedure Code(s):        --- Professional ---  43262, Endoscopic retrograde                            cholangiopancreatography (ERCP); with                            sphincterotomy/papillotomy                           43261, Endoscopic retrograde                            cholangiopancreatography (ERCP); with biopsy,                            single or multiple Diagnosis Code(s):        --- Professional ---                           K80.50, Calculus of bile duct without cholangitis                            or cholecystitis without obstruction CPT copyright 2019 American Medical Association. All rights reserved. The codes documented in this report are preliminary and upon coder review may  be revised to meet current compliance requirements. [name], MD 10/19/2019 4:00:45 PM This report has been signed electronically. Number of Addenda: 0

## 2019-10-19 NOTE — Anesthesia Procedure Notes (Signed)
Procedure Name: Intubation Date/Time: 10/19/2019 2:27 PM Performed by: Tara Rud D, CRNA Pre-anesthesia Checklist: Patient identified, Emergency Drugs available, Suction available and Patient being monitored Patient Re-evaluated:Patient Re-evaluated prior to induction Oxygen Delivery Method: Circle system utilized Preoxygenation: Pre-oxygenation with 100% oxygen Induction Type: IV induction Ventilation: Mask ventilation without difficulty Laryngoscope Size: Mac and 4 Grade View: Grade II Tube type: Oral Tube size: 7.5 mm Number of attempts: 1 Airway Equipment and Method: Stylet Placement Confirmation: ETT inserted through vocal cords under direct vision,  positive ETCO2 and breath sounds checked- equal and bilateral Secured at: 21 cm Tube secured with: Tape Dental Injury: Teeth and Oropharynx as per pre-operative assessment

## 2019-10-19 NOTE — Transfer of Care (Signed)
Immediate Anesthesia Transfer of Care Note  Patient: Christine Walker  Procedure(s) Performed: ENDOSCOPIC RETROGRADE CHOLANGIOPANCREATOGRAPHY (ERCP) WITH PROPOFOL (N/A ) BIOPSY SPHINCTEROTOMY REMOVAL OF STONES  Patient Location: PACU  Anesthesia Type:General  Level of Consciousness: awake, alert  and oriented  Airway & Oxygen Therapy: Patient Spontanous Breathing and Patient connected to nasal cannula oxygen  Post-op Assessment: Report given to RN and Post -op Vital signs reviewed and stable  Post vital signs: Reviewed and stable  Last Vitals:  Vitals Value Taken Time  BP 145/66 10/19/19 1603  Temp    Pulse 68 10/19/19 1604  Resp 12 10/19/19 1604  SpO2 100 % 10/19/19 1604  Vitals shown include unvalidated device data.  Last Pain:  Vitals:   10/19/19 1230  TempSrc: Oral  PainSc: 0-No pain         Complications: No complications documented.

## 2019-10-19 NOTE — Discharge Instructions (Signed)
YOU HAD AN ENDOSCOPIC PROCEDURE TODAY: Refer to the procedure report and other information in the discharge instructions given to you for any specific questions about what was found during the examination. If this information does not answer your questions, please call [place] office at [phone] to clarify.   YOU SHOULD EXPECT: Some feelings of bloating in the abdomen. Passage of more gas than usual. Walking can help get rid of the air that was put into your GI tract during the procedure and reduce the bloating. If you had a lower endoscopy (such as a colonoscopy or flexible sigmoidoscopy) you may notice spotting of blood in your stool or on the toilet paper. Some abdominal soreness may be present for a day or two, also.  DIET: Your first meal following the procedure should be a light meal and then it is ok to progress to your normal diet. A half-sandwich or bowl of soup is an example of a good first meal. Heavy or fried foods are harder to digest and may make you feel nauseous or bloated. Drink plenty of fluids but you should avoid alcoholic beverages for 24 hours. If you had a esophageal dilation, please see attached instructions for diet.    ACTIVITY: Your care partner should take you home directly after the procedure. You should plan to take it easy, moving slowly for the rest of the day. You can resume normal activity the day after the procedure however YOU SHOULD NOT DRIVE, use power tools, machinery or perform tasks that involve climbing or major physical exertion for 24 hours (because of the sedation medicines used during the test).   SYMPTOMS TO REPORT IMMEDIATELY: A gastroenterologist can be reached at any hour. Please call [phone]  for any of the following symptoms:   Following upper endoscopy (EGD, EUS, ERCP, esophageal dilation) Vomiting of blood or coffee ground material  New, significant abdominal pain  New, significant chest pain or pain under the shoulder blades  Painful or  persistently difficult swallowing  New shortness of breath  Black, tarry-looking or red, bloody stools  FOLLOW UP:  If any biopsies were taken you will be contacted by phone or by letter within the next 1-3 weeks. Call [phone]  if you have not heard about the biopsies in 3 weeks.  Please also call with any specific questions about appointments or follow up tests.  

## 2019-10-20 ENCOUNTER — Ambulatory Visit (HOSPITAL_COMMUNITY): Payer: Medicare Other

## 2019-10-20 ENCOUNTER — Encounter (HOSPITAL_COMMUNITY): Payer: Self-pay | Admitting: Gastroenterology

## 2019-10-25 ENCOUNTER — Ambulatory Visit (HOSPITAL_COMMUNITY): Payer: Medicare Other

## 2019-10-25 LAB — SURGICAL PATHOLOGY

## 2019-10-30 NOTE — Anesthesia Postprocedure Evaluation (Signed)
Anesthesia Post Note  Patient: Christine Walker  Procedure(s) Performed: ENDOSCOPIC RETROGRADE CHOLANGIOPANCREATOGRAPHY (ERCP) WITH PROPOFOL (N/A ) BIOPSY SPHINCTEROTOMY REMOVAL OF STONES     Patient location during evaluation: PACU Anesthesia Type: General Level of consciousness: awake and alert Pain management: pain level controlled Vital Signs Assessment: post-procedure vital signs reviewed and stable Respiratory status: spontaneous breathing, nonlabored ventilation, respiratory function stable and patient connected to nasal cannula oxygen Cardiovascular status: blood pressure returned to baseline and stable Postop Assessment: no apparent nausea or vomiting Anesthetic complications: no   No complications documented.  Last Vitals:  Vitals:   10/19/19 1620 10/19/19 1630  BP: (!) 142/65 (!) 152/65  Pulse: 69 61  Resp: 13 17  Temp:    SpO2: 99% 98%    Last Pain:  Vitals:   10/20/19 1036  TempSrc:   PainSc: 0-No pain                 Issai Werling S

## 2019-11-10 ENCOUNTER — Other Ambulatory Visit: Payer: Self-pay | Admitting: Interventional Cardiology

## 2019-11-18 ENCOUNTER — Other Ambulatory Visit: Payer: Self-pay | Admitting: Family Medicine

## 2019-11-18 DIAGNOSIS — I10 Essential (primary) hypertension: Secondary | ICD-10-CM

## 2019-11-23 ENCOUNTER — Other Ambulatory Visit: Payer: Self-pay | Admitting: Family Medicine

## 2019-11-23 DIAGNOSIS — E119 Type 2 diabetes mellitus without complications: Secondary | ICD-10-CM

## 2020-02-09 ENCOUNTER — Other Ambulatory Visit: Payer: Self-pay | Admitting: Endocrinology

## 2020-02-09 DIAGNOSIS — E049 Nontoxic goiter, unspecified: Secondary | ICD-10-CM

## 2020-02-15 ENCOUNTER — Other Ambulatory Visit: Payer: Self-pay | Admitting: Family Medicine

## 2020-02-15 DIAGNOSIS — I10 Essential (primary) hypertension: Secondary | ICD-10-CM

## 2020-02-19 ENCOUNTER — Ambulatory Visit
Admission: RE | Admit: 2020-02-19 | Discharge: 2020-02-19 | Disposition: A | Discharge status: Home / Self Care | Payer: Medicare Other | Source: Ambulatory Visit | Attending: Endocrinology | Admitting: Endocrinology

## 2020-02-19 DIAGNOSIS — E049 Nontoxic goiter, unspecified: Secondary | ICD-10-CM

## 2020-03-15 ENCOUNTER — Other Ambulatory Visit: Payer: Self-pay | Admitting: Obstetrics and Gynecology

## 2020-03-22 ENCOUNTER — Other Ambulatory Visit: Payer: Self-pay | Admitting: Family Medicine

## 2020-03-22 DIAGNOSIS — E119 Type 2 diabetes mellitus without complications: Secondary | ICD-10-CM

## 2020-04-03 ENCOUNTER — Telehealth: Payer: Self-pay | Admitting: *Deleted

## 2020-04-03 NOTE — Telephone Encounter (Signed)
Pharmacy faxed over paper   "We spoke to your patient about diabetes care and noticed your patient previously received statin therapy but has not filled it at a [facility] pharmacy in the last 180 days.  Your patient would like [place] to reach out on their behalf to determine if it is appropriate to restart the statin therapy.  Please send in new prescription for statin therapy if it is appropriate."

## 2020-04-03 NOTE — Telephone Encounter (Signed)
Reviewed patient chart.  It looks like patient was taken off the atorvastatin in April by Dr. Tamala Julian, cardiologist because it may have been the cause of her elevated enzymes.  Labs have not been checked for cholesterol  patient will need an appointment to discuss.  Voicemail full and could not leave message.

## 2020-04-04 NOTE — Telephone Encounter (Signed)
Patient scheduled to come in and discuss.

## 2020-04-16 ENCOUNTER — Ambulatory Visit: Payer: Medicare Other | Admitting: Family Medicine

## 2020-06-05 ENCOUNTER — Other Ambulatory Visit: Payer: Self-pay

## 2020-06-05 ENCOUNTER — Encounter: Payer: Self-pay | Admitting: Family Medicine

## 2020-06-05 ENCOUNTER — Ambulatory Visit (INDEPENDENT_AMBULATORY_CARE_PROVIDER_SITE_OTHER): Payer: Medicare Other | Admitting: Family Medicine

## 2020-06-05 VITALS — BP 140/80 | HR 68 | Temp 98.3°F | Ht 67.0 in | Wt 142.0 lb

## 2020-06-05 DIAGNOSIS — I10 Essential (primary) hypertension: Secondary | ICD-10-CM

## 2020-06-05 DIAGNOSIS — Z2821 Immunization not carried out because of patient refusal: Secondary | ICD-10-CM | POA: Diagnosis not present

## 2020-06-05 DIAGNOSIS — L309 Dermatitis, unspecified: Secondary | ICD-10-CM

## 2020-06-05 DIAGNOSIS — E1165 Type 2 diabetes mellitus with hyperglycemia: Secondary | ICD-10-CM | POA: Diagnosis not present

## 2020-06-05 MED ORDER — TRIAMCINOLONE ACETONIDE 0.1 % EX CREA: 1.0000 "application " | TOPICAL_CREAM | Freq: Two times a day (BID) | CUTANEOUS | 0 refills | Status: DC

## 2020-06-05 NOTE — Patient Instructions (Signed)
Give [place] 2-3 business days to get the results of your labs back.   Keep the diet clean and stay active.  Try not to itch.   Let us know if you need anything.
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
## 2020-06-06 LAB — LIPID PANEL
Cholesterol: 217 mg/dL — ABNORMAL HIGH (ref 0–200)
HDL: 69 mg/dL (ref >39.00)
LDL Cholesterol: 130 mg/dL — ABNORMAL HIGH (ref 0–99)
NonHDL: 147.67
Total CHOL/HDL Ratio: 3
Triglycerides: 86 mg/dL (ref 0.0–149.0)
VLDL: 17.2 mg/dL (ref 0.0–40.0)

## 2020-06-06 LAB — COMPREHENSIVE METABOLIC PANEL
ALT: 9 U/L (ref 0–35)
AST: 17 U/L (ref 0–37)
Albumin: 4.2 g/dL (ref 3.5–5.2)
Alkaline Phosphatase: 52 U/L (ref 39–117)
BUN: 8 mg/dL (ref 6–23)
CO2: 31 mEq/L (ref 19–32)
Calcium: 9.7 mg/dL (ref 8.4–10.5)
Chloride: 103 mEq/L (ref 96–112)
Creatinine, Ser: 0.71 mg/dL (ref 0.40–1.20)
GFR: 87.79 mL/min (ref >60.00)
Glucose, Bld: 91 mg/dL (ref 70–99)
Potassium: 4.2 mEq/L (ref 3.5–5.1)
Sodium: 140 mEq/L (ref 135–145)
Total Bilirubin: 0.5 mg/dL (ref 0.2–1.2)
Total Protein: 6.4 g/dL (ref 6.0–8.3)

## 2020-06-06 LAB — MICROALBUMIN / CREATININE URINE RATIO
Creatinine,U: 118.5 mg/dL
Microalb Creat Ratio: 0.6 mg/g (ref 0.0–30.0)
Microalb, Ur: 0.7 mg/dL (ref 0.0–1.9)

## 2020-06-06 LAB — HEMOGLOBIN A1C: Hgb A1c MFr Bld: 6.4 % (ref 4.6–6.5)

## 2020-06-07 ENCOUNTER — Telehealth: Payer: Self-pay | Admitting: Family Medicine

## 2020-06-07 DIAGNOSIS — E785 Hyperlipidemia, unspecified: Secondary | ICD-10-CM

## 2020-06-07 MED ORDER — ROSUVASTATIN CALCIUM 20 MG PO TABS
20.0000 mg | ORAL_TABLET | Freq: Every day | ORAL | 3 refills | Status: DC
Start: 2020-06-07 — End: 2020-09-03

## 2020-06-07 NOTE — Addendum Note (Signed)
Addended by: Sharon Seller B on: 06/07/2020 01:00 PM   Modules accepted: Orders

## 2020-06-07 NOTE — Telephone Encounter (Signed)
OK. I will send in the statin per our discussion in her visit. Please schedule a lab visit in 6 weeks and order a lipid panel and hep function panel. Thank you.

## 2020-06-07 NOTE — Telephone Encounter (Signed)
Called the patient informed ok to change PCP. Put in order for labs and made appt.

## 2020-06-07 NOTE — Telephone Encounter (Signed)
The patient would like to change providers from Dr. Carollee Herter to Dr. Nani Ravens Advise please

## 2020-06-07 NOTE — Telephone Encounter (Signed)
Fine with me

## 2020-07-25 ENCOUNTER — Other Ambulatory Visit (INDEPENDENT_AMBULATORY_CARE_PROVIDER_SITE_OTHER): Payer: Medicare Other

## 2020-07-25 ENCOUNTER — Other Ambulatory Visit: Payer: Self-pay

## 2020-07-25 DIAGNOSIS — E785 Hyperlipidemia, unspecified: Secondary | ICD-10-CM | POA: Diagnosis not present

## 2020-07-25 LAB — LIPID PANEL
Cholesterol: 157 mg/dL (ref 0–200)
HDL: 84 mg/dL (ref >39.00)
LDL Cholesterol: 64 mg/dL (ref 0–99)
NonHDL: 73.17
Total CHOL/HDL Ratio: 2
Triglycerides: 48 mg/dL (ref 0.0–149.0)
VLDL: 9.6 mg/dL (ref 0.0–40.0)

## 2020-07-25 LAB — HEPATIC FUNCTION PANEL
ALT: 10 U/L (ref 0–35)
AST: 18 U/L (ref 0–37)
Albumin: 4.2 g/dL (ref 3.5–5.2)
Alkaline Phosphatase: 63 U/L (ref 39–117)
Bilirubin, Direct: 0.1 mg/dL (ref 0.0–0.3)
Total Bilirubin: 0.7 mg/dL (ref 0.2–1.2)
Total Protein: 6.8 g/dL (ref 6.0–8.3)

## 2020-09-03 ENCOUNTER — Other Ambulatory Visit: Payer: Self-pay | Admitting: Family Medicine
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
## 2020-11-15 ENCOUNTER — Other Ambulatory Visit: Payer: Self-pay

## 2020-11-15 ENCOUNTER — Ambulatory Visit (INDEPENDENT_AMBULATORY_CARE_PROVIDER_SITE_OTHER): Payer: Medicare Other | Admitting: Physician Assistant

## 2020-11-15 ENCOUNTER — Encounter: Payer: Self-pay | Admitting: Physician Assistant

## 2020-11-15 VITALS — BP 154/72 | HR 70 | Ht 67.0 in | Wt 144.6 lb

## 2020-11-15 DIAGNOSIS — I2510 Atherosclerotic heart disease of native coronary artery without angina pectoris: Secondary | ICD-10-CM | POA: Diagnosis not present

## 2020-11-15 DIAGNOSIS — I10 Essential (primary) hypertension: Secondary | ICD-10-CM | POA: Diagnosis not present

## 2020-11-15 DIAGNOSIS — E782 Mixed hyperlipidemia: Secondary | ICD-10-CM

## 2020-11-15 MED ORDER — ASPIRIN EC 81 MG PO TBEC: 81.0000 mg | DELAYED_RELEASE_TABLET | Freq: Every day | ORAL | 3 refills | Status: AC

## 2020-11-15 MED ORDER — LOSARTAN POTASSIUM 25 MG PO TABS: 25.0000 mg | ORAL_TABLET | Freq: Every day | ORAL | 11 refills | Status: DC

## 2020-11-15 MED ORDER — ROSUVASTATIN CALCIUM 20 MG PO TABS: 20.0000 mg | ORAL_TABLET | Freq: Every day | ORAL | 11 refills | Status: DC

## 2020-11-15 MED ORDER — NITROGLYCERIN 0.4 MG SL SUBL: 0.4000 mg | SUBLINGUAL_TABLET | SUBLINGUAL | 3 refills | Status: DC | PRN

## 2020-11-15 NOTE — Patient Instructions (Signed)
Medication Instructions:   Your physician recommends that you continue on your current medications as directed. Please refer to the Current Medication list given to you today.  *If you need a refill on your cardiac medications before your next appointment, please call your pharmacy*   Lab Work  Your physician recommends that you return for a FASTING lipid profile/cmet ON Wednesday, September 14 between 7:30 - 4:30 fasting from midnight the night before.   If you have labs (blood work) drawn today and your tests are completely normal, you will receive your results only by: [place] (if you have MyChart) OR A paper copy in the mail If you have any lab test that is abnormal or we need to change your treatment, we will call you to review the results.   Testing/Procedures:  -NONE   Follow-Up: At [facility], you and your health needs are our priority.  As part of our continuing mission to provide you with exceptional heart care, we have created designated Provider Care Teams.  These Care Teams include your primary Cardiologist (physician) and Advanced Practice Providers (APPs -  Physician Assistants and Nurse Practitioners) who all work together to provide you with the care you need, when you need it.  We recommend signing up for the patient portal called "MyChart".  Sign up information is provided on this After Visit Summary.  MyChart is used to connect with patients for Virtual Visits (Telemedicine).  Patients are able to view lab/test results, encounter notes, upcoming appointments, etc.  Non-urgent messages can be sent to your provider as well.   To learn more about what you can do with MyChart, go to [email or web address].    Your next appointment:   6 month(s) WITH Dr.Smith on Wednesday, February 15 @ 10:00 am.   The format for your next appointment:   In Person  Provider:   Daneen Schick, MD   Other Instructions

## 2020-12-25 ENCOUNTER — Other Ambulatory Visit: Payer: Medicare Other

## 2021-01-07 ENCOUNTER — Other Ambulatory Visit: Payer: Medicare Other

## 2021-01-07 ENCOUNTER — Other Ambulatory Visit: Payer: Self-pay

## 2021-01-07 DIAGNOSIS — I2510 Atherosclerotic heart disease of native coronary artery without angina pectoris: Secondary | ICD-10-CM

## 2021-01-07 DIAGNOSIS — E782 Mixed hyperlipidemia: Secondary | ICD-10-CM

## 2021-01-07 DIAGNOSIS — I10 Essential (primary) hypertension: Secondary | ICD-10-CM

## 2021-01-08 ENCOUNTER — Other Ambulatory Visit: Payer: Medicare Other

## 2021-01-08 LAB — COMPREHENSIVE METABOLIC PANEL
ALT: 13 IU/L (ref 0–32)
AST: 16 IU/L (ref 0–40)
Albumin/Globulin Ratio: 2.1 (ref 1.2–2.2)
Albumin: 4.5 g/dL (ref 3.8–4.8)
Alkaline Phosphatase: 80 IU/L (ref 44–121)
BUN/Creatinine Ratio: 13 (ref 12–28)
BUN: 11 mg/dL (ref 8–27)
Bilirubin Total: 0.5 mg/dL (ref 0.0–1.2)
CO2: 26 mmol/L (ref 20–29)
Calcium: 9.6 mg/dL (ref 8.7–10.3)
Chloride: 103 mmol/L (ref 96–106)
Creatinine, Ser: 0.85 mg/dL (ref 0.57–1.00)
Globulin, Total: 2.1 g/dL (ref 1.5–4.5)
Glucose: 115 mg/dL — ABNORMAL HIGH (ref 70–99)
Potassium: 4.6 mmol/L (ref 3.5–5.2)
Sodium: 142 mmol/L (ref 134–144)
Total Protein: 6.6 g/dL (ref 6.0–8.5)
eGFR: 75 mL/min/{1.73_m2} (ref >59)

## 2021-01-08 LAB — LIPID PANEL
Chol/HDL Ratio: 2 ratio (ref 0.0–4.4)
Cholesterol, Total: 160 mg/dL (ref 100–199)
HDL: 79 mg/dL (ref >39)
LDL Chol Calc (NIH): 69 mg/dL (ref 0–99)
Triglycerides: 62 mg/dL (ref 0–149)
VLDL Cholesterol Cal: 12 mg/dL (ref 5–40)
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
## 2021-05-28 ENCOUNTER — Other Ambulatory Visit: Payer: Self-pay

## 2021-05-28 ENCOUNTER — Encounter: Payer: Self-pay | Admitting: Interventional Cardiology

## 2021-05-28 ENCOUNTER — Ambulatory Visit (INDEPENDENT_AMBULATORY_CARE_PROVIDER_SITE_OTHER): Payer: Medicare Other | Admitting: Interventional Cardiology

## 2021-05-28 VITALS — BP 148/74 | HR 55 | Ht 67.0 in | Wt 142.6 lb

## 2021-05-28 DIAGNOSIS — I10 Essential (primary) hypertension: Secondary | ICD-10-CM

## 2021-05-28 DIAGNOSIS — I4510 Unspecified right bundle-branch block: Secondary | ICD-10-CM | POA: Diagnosis not present

## 2021-05-28 DIAGNOSIS — I2510 Atherosclerotic heart disease of native coronary artery without angina pectoris: Secondary | ICD-10-CM | POA: Diagnosis not present

## 2021-05-28 DIAGNOSIS — E782 Mixed hyperlipidemia: Secondary | ICD-10-CM | POA: Diagnosis not present

## 2021-05-28 LAB — HEPATIC FUNCTION PANEL
ALT: 16 IU/L (ref 0–32)
AST: 31 IU/L (ref 0–40)
Albumin: 4.8 g/dL (ref 3.8–4.8)
Alkaline Phosphatase: 78 IU/L (ref 44–121)
Bilirubin Total: 0.7 mg/dL (ref 0.0–1.2)
Bilirubin, Direct: 0.21 mg/dL (ref 0.00–0.40)
Total Protein: 7.1 g/dL (ref 6.0–8.5)

## 2021-05-28 LAB — LIPID PANEL
Chol/HDL Ratio: 4.2 ratio (ref 0.0–4.4)
Cholesterol, Total: 256 mg/dL — ABNORMAL HIGH (ref 100–199)
HDL: 61 mg/dL (ref >39)
LDL Chol Calc (NIH): 178 mg/dL — ABNORMAL HIGH (ref 0–99)
Triglycerides: 96 mg/dL (ref 0–149)
VLDL Cholesterol Cal: 17 mg/dL (ref 5–40)

## 2021-05-28 NOTE — Patient Instructions (Signed)
Medication Instructions:  Your physician recommends that you continue on your current medications as directed. Please refer to the Current Medication list given to you today.  *If you need a refill on your cardiac medications before your next appointment, please call your pharmacy*   Lab Work: Lipid and Liver today  If you have labs (blood work) drawn today and your tests are completely normal, you will receive your results only by: Christine Walker (if you have MyChart) OR A paper copy in the mail If you have any lab test that is abnormal or we need to change your treatment, we will call you to review the results.   Testing/Procedures: None   Follow-Up: At [facility], you and your health needs are our priority.  As part of our continuing mission to provide you with exceptional heart care, we have created designated Provider Care Teams.  These Care Teams include your primary Cardiologist (physician) and Advanced Practice Providers (APPs -  Physician Assistants and Nurse Practitioners) who all work together to provide you with the care you need, when you need it.  We recommend signing up for the patient portal called "MyChart".  Sign up information is provided on this After Visit Summary.  MyChart is used to connect with patients for Virtual Visits (Telemedicine).  Patients are able to view lab/test results, encounter notes, upcoming appointments, etc.  Non-urgent messages can be sent to your provider as well.   To learn more about what you can do with MyChart, go to [email or web address].    Your next appointment:   1 year(s)  The format for your next appointment:   In Person  Provider:   Sinclair Grooms, MD     Other Instructions

## 2021-05-29 ENCOUNTER — Telehealth: Payer: Self-pay | Admitting: *Deleted

## 2021-05-29 DIAGNOSIS — I2510 Atherosclerotic heart disease of native coronary artery without angina pectoris: Secondary | ICD-10-CM

## 2021-05-29 DIAGNOSIS — E782 Mixed hyperlipidemia: Secondary | ICD-10-CM

## 2021-05-29 MED ORDER — ROSUVASTATIN CALCIUM 20 MG PO TABS: 20.0000 mg | ORAL_TABLET | Freq: Every day | ORAL | 11 refills | Status: DC

## 2021-05-29 NOTE — Telephone Encounter (Signed)
-----   Message from Belva Crome, MD sent at 05/29/2021 11:02 AM EST ----- Let the patient know the cholesterol is SKY HIGH!!! It is a silent killer. Need to get back on therapy and continue for life. Rosuvastatin 20 mg daily and recheck Liver and lipid in 6 weeks. A copy will be sent to Carollee Herter, Alferd Apa, DO

## 2021-05-29 NOTE — Telephone Encounter (Signed)
Spoke with pt and made her aware of results and recommendations.  Pt agreeable to plan.  She will come for labs on 07/15/21.

## 2021-06-10 LAB — HM DIABETES EYE EXAM

## 2021-07-15 ENCOUNTER — Other Ambulatory Visit: Payer: Medicare Other

## 2021-10-07 ENCOUNTER — Telehealth: Payer: Self-pay | Admitting: Family Medicine

## 2021-10-07 NOTE — Telephone Encounter (Signed)
Pt called stating that she was calling ahead to let us know that she has her dental procedure scheduled for 6.29.23 in reference to getting amoxicillin for post op.

## 2021-10-08 ENCOUNTER — Other Ambulatory Visit: Payer: Self-pay | Admitting: Family Medicine

## 2021-10-08 MED ORDER — AMOXICILLIN 500 MG PO CAPS
ORAL_CAPSULE | ORAL | 2 refills | Status: AC
Start: 2021-10-08 — End: ?

## 2021-11-27 ENCOUNTER — Other Ambulatory Visit: Payer: Self-pay | Admitting: Endocrinology

## 2021-11-27 DIAGNOSIS — E049 Nontoxic goiter, unspecified: Secondary | ICD-10-CM

## 2021-12-01 ENCOUNTER — Ambulatory Visit
Admission: RE | Admit: 2021-12-01 | Discharge: 2021-12-01 | Disposition: A | Discharge status: Home / Self Care | Payer: Medicare Other | Source: Ambulatory Visit | Attending: Endocrinology | Admitting: Endocrinology

## 2021-12-01 DIAGNOSIS — E049 Nontoxic goiter, unspecified: Secondary | ICD-10-CM

## 2022-01-26 ENCOUNTER — Ambulatory Visit: Payer: Medicare Other | Admitting: Interventional Cardiology
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
## 2022-03-30 ENCOUNTER — Encounter: Payer: Self-pay | Admitting: Interventional Cardiology

## 2022-03-30 ENCOUNTER — Ambulatory Visit: Payer: Medicare Other | Attending: Interventional Cardiology | Admitting: Interventional Cardiology

## 2022-03-30 VITALS — BP 156/90 | HR 71 | Ht 67.0 in | Wt 150.2 lb

## 2022-03-30 DIAGNOSIS — I10 Essential (primary) hypertension: Secondary | ICD-10-CM

## 2022-03-30 DIAGNOSIS — I2510 Atherosclerotic heart disease of native coronary artery without angina pectoris: Secondary | ICD-10-CM | POA: Diagnosis not present

## 2022-03-30 DIAGNOSIS — R7303 Prediabetes: Secondary | ICD-10-CM

## 2022-03-30 DIAGNOSIS — I452 Bifascicular block: Secondary | ICD-10-CM

## 2022-03-30 DIAGNOSIS — E782 Mixed hyperlipidemia: Secondary | ICD-10-CM

## 2022-03-30 DIAGNOSIS — I4510 Unspecified right bundle-branch block: Secondary | ICD-10-CM

## 2022-03-30 MED ORDER — LOSARTAN POTASSIUM 25 MG PO TABS: 25.0000 mg | ORAL_TABLET | Freq: Every day | ORAL | 3 refills | Status: DC

## 2022-03-30 MED ORDER — HYDROCHLOROTHIAZIDE 12.5 MG PO CAPS: 12.5000 mg | ORAL_CAPSULE | Freq: Every day | ORAL | 3 refills | Status: DC

## 2022-03-30 MED ORDER — EMPAGLIFLOZIN 10 MG PO TABS: 10.0000 mg | ORAL_TABLET | Freq: Every day | ORAL | 11 refills | Status: DC

## 2022-03-30 MED ORDER — ROSUVASTATIN CALCIUM 20 MG PO TABS: 20.0000 mg | ORAL_TABLET | Freq: Every day | ORAL | 3 refills | Status: DC

## 2022-03-30 NOTE — Patient Instructions (Signed)
Medication Instructions:  Your physician recommends that you continue on your current medications as directed. Please refer to the Current Medication list given to you today.  *If you need a refill on your cardiac medications before your next appointment, please call your pharmacy*  Lab Work: In 6 weeks: fasting Lipid panel, CMET, Hemoglobin A1c If you have labs (blood work) drawn today and your tests are completely normal, you will receive your results only by: MyChart Message (if you have MyChart) OR A paper copy in the mail If you have any lab test that is abnormal or we need to change your treatment, we will call you to review the results.  Follow-Up: At [facility], you and your health needs are our priority.  As part of our continuing mission to provide you with exceptional heart care, we have created designated Provider Care Teams.  These Care Teams include your primary Cardiologist (physician) and Advanced Practice Providers (APPs -  Physician Assistants and Nurse Practitioners) who all work together to provide you with the care you need, when you need it.  Your next appointment:   6 month(s)  The format for your next appointment:   In Person  Provider:   Gwyndolyn Kaufman, MD  Important Information About Sugar

## 2022-04-08 ENCOUNTER — Telehealth: Payer: Self-pay | Admitting: *Deleted

## 2022-04-08 NOTE — Telephone Encounter (Signed)
LMOM for pt to schedule AWV. 

## 2022-05-13 ENCOUNTER — Other Ambulatory Visit: Payer: Medicare Other
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
## 2022-10-06 ENCOUNTER — Encounter: Payer: Self-pay | Admitting: Cardiology

## 2022-10-06 ENCOUNTER — Ambulatory Visit: Payer: Medicare Other | Attending: Cardiology | Admitting: Cardiology

## 2022-10-06 VITALS — BP 138/80 | HR 72 | Ht 67.0 in | Wt 150.4 lb

## 2022-10-06 DIAGNOSIS — Z79899 Other long term (current) drug therapy: Secondary | ICD-10-CM | POA: Diagnosis not present

## 2022-10-06 DIAGNOSIS — I2510 Atherosclerotic heart disease of native coronary artery without angina pectoris: Secondary | ICD-10-CM | POA: Diagnosis not present

## 2022-10-06 DIAGNOSIS — E782 Mixed hyperlipidemia: Secondary | ICD-10-CM

## 2022-10-06 DIAGNOSIS — R7303 Prediabetes: Secondary | ICD-10-CM

## 2022-10-06 DIAGNOSIS — I341 Nonrheumatic mitral (valve) prolapse: Secondary | ICD-10-CM

## 2022-10-06 DIAGNOSIS — I10 Essential (primary) hypertension: Secondary | ICD-10-CM

## 2022-10-06 MED ORDER — ROSUVASTATIN CALCIUM 20 MG PO TABS: 20.0000 mg | ORAL_TABLET | Freq: Every day | ORAL | 3 refills | Status: DC

## 2022-10-06 MED ORDER — HYDROCHLOROTHIAZIDE 12.5 MG PO CAPS: 12.5000 mg | ORAL_CAPSULE | Freq: Every day | ORAL | 3 refills | Status: DC

## 2022-10-06 MED ORDER — EMPAGLIFLOZIN 10 MG PO TABS: 10.0000 mg | ORAL_TABLET | Freq: Every day | ORAL | 11 refills | Status: DC

## 2022-10-06 MED ORDER — LOSARTAN POTASSIUM 25 MG PO TABS: 25.0000 mg | ORAL_TABLET | Freq: Every day | ORAL | 3 refills | Status: DC

## 2022-10-06 MED ORDER — NITROGLYCERIN 0.4 MG SL SUBL: 0.4000 mg | SUBLINGUAL_TABLET | SUBLINGUAL | 3 refills | Status: AC | PRN

## 2022-10-06 NOTE — Patient Instructions (Signed)
Medication Instructions:   Your physician recommends that you continue on your current medications as directed. Please refer to the Current Medication list given to you today.  *If you need a refill on your cardiac medications before your next appointment, please call your pharmacy*   Lab Work:  TODAY--CMET, HEMOGLOBIN A1C, AND LIPIDS  If you have labs (blood work) drawn today and your tests are completely normal, you will receive your results only by: MyChart Message (if you have MyChart) OR A paper copy in the mail If you have any lab test that is abnormal or we need to change your treatment, we will call you to review the results.   Testing/Procedures:  Your physician has requested that you have an echocardiogram. Echocardiography is a painless test that uses sound waves to create images of your heart. It provides your doctor with information about the size and shape of your heart and how well your heart's chambers and valves are working. This procedure takes approximately one hour. There are no restrictions for this procedure. Please do NOT wear cologne, perfume, aftershave, or lotions (deodorant is allowed). Please arrive 15 minutes prior to your appointment time.     Follow-Up: At [facility], you and your health needs are our priority.  As part of our continuing mission to provide you with exceptional heart care, we have created designated Provider Care Teams.  These Care Teams include your primary Cardiologist (physician) and Advanced Practice Providers (APPs -  Physician Assistants and Nurse Practitioners) who all work together to provide you with the care you need, when you need it.  We recommend signing up for the patient portal called "MyChart".  Sign up information is provided on this After Visit Summary.  MyChart is used to connect with patients for Virtual Visits (Telemedicine).  Patients are able to view lab/test results, encounter notes, upcoming appointments,  etc.  Non-urgent messages can be sent to your provider as well.   To learn more about what you can do with MyChart, go to [email or web address].    Your next appointment:   6 month(s)  Provider:   Chilton Si, MD

## 2022-10-30 ENCOUNTER — Ambulatory Visit (HOSPITAL_COMMUNITY): Payer: Medicare Other

## 2022-11-12 ENCOUNTER — Ambulatory Visit (HOSPITAL_COMMUNITY): Payer: Medicare Other | Attending: Cardiology

## 2023-01-01 ENCOUNTER — Other Ambulatory Visit: Payer: Self-pay | Admitting: Obstetrics and Gynecology

## 2023-01-01 DIAGNOSIS — R928 Other abnormal and inconclusive findings on diagnostic imaging of breast: Secondary | ICD-10-CM

## 2023-01-13 ENCOUNTER — Ambulatory Visit
Admission: RE | Admit: 2023-01-13 | Discharge: 2023-01-13 | Disposition: A | Discharge status: Home / Self Care | Payer: Medicare Other | Source: Ambulatory Visit | Attending: Obstetrics and Gynecology | Admitting: Obstetrics and Gynecology

## 2023-01-13 ENCOUNTER — Ambulatory Visit: Payer: Medicare Other

## 2023-01-13 DIAGNOSIS — R928 Other abnormal and inconclusive findings on diagnostic imaging of breast: Secondary | ICD-10-CM

## 2023-09-07 ENCOUNTER — Telehealth (HOSPITAL_BASED_OUTPATIENT_CLINIC_OR_DEPARTMENT_OTHER): Payer: Self-pay

## 2023-09-07 NOTE — Telephone Encounter (Addendum)
 Current echo ordered extended but still active. Will route to scheduling team to schedule echo and follow up with Dr. Theodis Fiscal   ----- Message from Nurse Aneta Bar sent at 09/07/2023  9:53 AM EDT ----- Looks like she is recalled to see Dr. Theodis Fiscal - I will cc Marci Setter in on this so that she can put the echo order under Dr. Theodis Fiscal, being the pt will be seeing her at [facility]--she does need an appt with her scheduled as well.   Thanks, James Mcardle ----- Message ----- From: Jami Mcclintock Sent: 09/07/2023   9:21 AM EDT To: Peggi Bowels, LPN  Hey there! Patient is in my WQ for an echocardiogram... I think I will need a new order... does patient need to see cardiologist before scheduling since old Dr. Ardell Beauvais patient?

## 2023-10-08 ENCOUNTER — Ambulatory Visit (HOSPITAL_COMMUNITY)
Admission: RE | Admit: 2023-10-08 | Discharge: 2023-10-08 | Disposition: A | Discharge status: Home / Self Care | Source: Ambulatory Visit | Attending: Cardiology | Admitting: Cardiology

## 2023-10-08 ENCOUNTER — Ambulatory Visit: Payer: Self-pay | Admitting: Cardiology

## 2023-10-08 DIAGNOSIS — I2510 Atherosclerotic heart disease of native coronary artery without angina pectoris: Secondary | ICD-10-CM

## 2023-10-08 DIAGNOSIS — Z79899 Other long term (current) drug therapy: Secondary | ICD-10-CM | POA: Insufficient documentation

## 2023-10-08 DIAGNOSIS — E782 Mixed hyperlipidemia: Secondary | ICD-10-CM | POA: Diagnosis present

## 2023-10-08 DIAGNOSIS — I341 Nonrheumatic mitral (valve) prolapse: Secondary | ICD-10-CM

## 2023-10-08 LAB — ECHOCARDIOGRAM COMPLETE
Area-P 1/2: 2.69 cm2
P 1/2 time: 493 ms
S' Lateral: 1.6 cm

## 2023-10-29 ENCOUNTER — Encounter: Payer: Self-pay | Admitting: Advanced Practice Midwife

## 2024-01-25 ENCOUNTER — Ambulatory Visit (HOSPITAL_BASED_OUTPATIENT_CLINIC_OR_DEPARTMENT_OTHER): Admitting: Cardiovascular Disease

## 2024-01-31 ENCOUNTER — Encounter (HOSPITAL_BASED_OUTPATIENT_CLINIC_OR_DEPARTMENT_OTHER): Payer: Self-pay

## 2024-02-03 ENCOUNTER — Ambulatory Visit (INDEPENDENT_AMBULATORY_CARE_PROVIDER_SITE_OTHER): Admitting: Cardiovascular Disease

## 2024-02-03 ENCOUNTER — Encounter (HOSPITAL_BASED_OUTPATIENT_CLINIC_OR_DEPARTMENT_OTHER): Payer: Self-pay | Admitting: Cardiovascular Disease

## 2024-02-03 VITALS — BP 154/82 | HR 64 | Ht 67.0 in | Wt 140.5 lb

## 2024-02-03 DIAGNOSIS — I4510 Unspecified right bundle-branch block: Secondary | ICD-10-CM | POA: Diagnosis not present

## 2024-02-03 DIAGNOSIS — I341 Nonrheumatic mitral (valve) prolapse: Secondary | ICD-10-CM | POA: Diagnosis not present

## 2024-02-03 DIAGNOSIS — I2510 Atherosclerotic heart disease of native coronary artery without angina pectoris: Secondary | ICD-10-CM | POA: Diagnosis not present

## 2024-02-03 DIAGNOSIS — G43009 Migraine without aura, not intractable, without status migrainosus: Secondary | ICD-10-CM

## 2024-02-03 DIAGNOSIS — E782 Mixed hyperlipidemia: Secondary | ICD-10-CM

## 2024-02-03 DIAGNOSIS — I10 Essential (primary) hypertension: Secondary | ICD-10-CM | POA: Diagnosis not present

## 2024-02-03 MED ORDER — LOSARTAN POTASSIUM-HCTZ 50-12.5 MG PO TABS: 1.0000 | ORAL_TABLET | Freq: Every day | ORAL | 3 refills | Status: AC

## 2024-02-03 NOTE — Progress Notes (Signed)
 Cardiology Office Note:  .   Date:  02/03/2024  ID:  Christine Walker, DOB 1950-08-23, MRN [id number] PCP: Antonio Meth, Jamee JONELLE, DO  [facility] Providers Cardiologist:  Victory LELON Claudene DOUGLAS, MD (Inactive)    History of Present Illness: .    Christine Walker is a 73 y.o. female with CAD status post LAD PCI (2017), hypertension, hyperlipidemia, RBBB, and GERD here for follow-up.   Her cath revealed 90% proximal LAD stenosis that was stented.  She also had 99% ostial D1 and had PTCA/balloon angioplasty.  50% RCA and65% posterior atrial 65% was medically managed. She was previously a patient of Dr. Claudene.  She last saw Dr. Hobart 09/2022 and was doing well.  Blood pressure slightly above goal and her prior antihypertensives losartan  and hydrochlorothiazide  were resumed.   Discussed the use of AI scribe software for clinical note transcription with the patient, who gave verbal consent to proceed.  History of Present Illness Christine Walker has been experiencing significant stress due to her caregiving responsibilities for her sister, who has undergone multiple amputations and is currently in rehabilitation. This situation has been ongoing for months and has disrupted her daily routine.  She maintains a walking routine, achieving over 5,000 steps a day, and feels good during walks with no chest pain or pressure. She experiences shortness of breath only when tired before starting her walk.  Her diet has been inconsistent due to her caregiving duties. She typically consumes a hot breakfast of grits, eggs, salmon, cheese toast, and occasionally bacon or sausage. She also eats blueberry pancakes and drinks hibiscus tea daily. Her appetite has been poor, and she often eats out, preferring 'good food'.  She has not checked her blood pressure in the past week but recalls recent readings of 122/81 mmHg and 140/88 mmHg. She has stopped taking her heart medications, except for aspirin , due to concerns about  side effects and has switched to natural supplements. She has been off rosuvastatin  for several months.  She has a history of coronary artery disease with a stent placed in the left anterior descending artery, a 99% blockage in the diagonal artery treated medically, and moderate blockage on the right. No chest pain, pressure, or swelling in her legs or feet.  ROS:  As per HPI  Studies Reviewed: SABRA   EKG Interpretation Date/Time:  Thursday February 03 2024 09:55:06 EDT Ventricular Rate:  64 PR Interval:  172 QRS Duration:  128 QT Interval:  462 QTC Calculation: 476 R Axis:   -78  Text Interpretation: Normal sinus rhythm Right bundle branch block Left axis deviation Bifascicular block Septal infarct , age undetermined When compared with ECG of 05-Sep-2015 05:04, T wave inversion no longer evident in Anterior leads Confirmed by Raford Riggs ([id number]) on 02/03/2024 9:58:36 AM    CARDIAC CATHETERIZATION 09/04/2015   Narrative  Prox RCA to Mid RCA lesion, 50% stenosed.  Post Atrio lesion, 65% stenosed.  Ost LAD to Prox LAD lesion, 90% stenosed.  Ost 1st Diag to 1st Diag lesion, 99% stenosed.  Dist LAD lesion, 20% stenosed.  The left ventricular systolic function is normal.   1. Severe stenosis proximal LAD, now s/p DES x 1 proximal LAD 2. PTCA/balloon angioplasty only ostium Diagonal 3. Moderate stenosis mid RCA. This does not appear to be flow limiting. Moderate stenosis ostium right posterolateral branch. 4. Normal LV systolic function.   Recommendations: Will continue ASA/Plavix  for one year. Will start statin and beta blocker. D/C home tomorrow am  if stable.  Echo 10/08/23: IMPRESSIONS     1. Left ventricular ejection fraction, by estimation, is 65 to 70%. Left  ventricular ejection fraction by 3D volume is 67 %. The left ventricle has  normal function. The left ventricle has no regional wall motion  abnormalities. There is mild left  ventricular hypertrophy.   2.  Right ventricular systolic function is normal. The right ventricular  size is normal. There is normal pulmonary artery systolic pressure.   3. Left atrial size was moderately dilated.   4. The mitral valve is grossly normal. Mild mitral valve regurgitation.  There is mild late systolic prolapse of the middle segment of the anterior  leaflet of the mitral valve.   5. The aortic valve is tricuspid. Aortic valve regurgitation trace to  mild. Aortic valve sclerosis is present, with no evidence of aortic valve  stenosis. Aortic regurgitation PHT measures 493 msec.   6. The inferior vena cava is normal in size with greater than 50%  respiratory variability, suggesting right atrial pressure of 3 mmHg.  Risk Assessment/Calculations:     HYPERTENSION CONTROL Vitals:   02/03/24 0943 02/03/24 1019  BP: (!) 168/88 (!) 154/82    The patient's blood pressure is elevated above target today.  In order to address the patient's elevated BP: A new medication was prescribed today.          Physical Exam:   VS:  BP (!) 154/82   Pulse 64   Ht 5' 7 (1.702 m)   Wt 140 lb 8 oz (63.7 kg)   SpO2 97%   BMI 22.01 kg/m  , BMI Body mass index is 22.01 kg/m. GENERAL:  Well appearing HEENT: Pupils equal round and reactive, fundi not visualized, oral mucosa unremarkable NECK:  No jugular venous distention, waveform within normal limits, carotid upstroke brisk and symmetric, no bruits, no thyromegaly LUNGS:  Clear to auscultation bilaterally HEART:  RRR.  PMI not displaced or sustained,S1 and S2 within normal limits, no S3, no S4, no clicks, no rubs, no murmurs ABD:  Flat, positive bowel sounds normal in frequency in pitch, no bruits, no rebound, no guarding, no midline pulsatile mass, no hepatomegaly, no splenomegaly EXT:  2 plus pulses throughout, no edema, no cyanosis no clubbing SKIN:  No rashes no nodules NEURO:  Cranial nerves II through XII grossly intact, motor grossly intact throughout PSYCH:   Cognitively intact, oriented to person place and time   ASSESSMENT AND PLAN: .    Assessment & Plan # Atherosclerotic heart disease with prior stent placement Atherosclerotic heart disease with prior stent in LAD. Moderate RCA blockage, 99% diagonal artery blockage s/p PTCA managed medically. Educated on statin benefits and addressed side effect concerns. - Restart rosuvastatin  to manage cholesterol levels. - Check cholesterol levels to assess current status. - Educate on maintaining LDL cholesterol under 70 mg/dL.  # Mixed hyperlipidemia Mixed hyperlipidemia with recent rosuvastatin  discontinuation. Discussed cholesterol management to prevent cardiovascular events. She agreed to restart statin therapy. - Restart rosuvastatin  to manage cholesterol levels. - Check cholesterol levels to assess current status.  # Essential hypertension Essential hypertension with recent medication discontinuation. Discussed risks of uncontrolled hypertension and importance of blood pressure control. She agreed to restart medication and monitor blood pressure closely. Recommended using an upper arm cuff for accurate readings. - Restart losartan  and hydrochlorothiazide , potentially as a combination pill. - Provide a blood pressure tracking sheet for home monitoring twice daily. - Bring home blood pressure monitor to next visit to  compare accuracy with office readings.        Dispo: f/u 2 months PharmD/APP.  6 months with me.   Signed, Annabella Scarce, MD

## 2024-02-03 NOTE — Patient Instructions (Signed)
 Medication Instructions:  START LOSARTAN  HCT 50-12.5 MG DAILY   *If you need a refill on your cardiac medications before your next appointment, please call your pharmacy*  Lab Work: LP/CMET TODAY   If you have labs (blood work) drawn today and your tests are completely normal, you will receive your results only by: MyChart Message (if you have MyChart) OR A paper copy in the mail If you have any lab test that is abnormal or we need to change your treatment, we will call you to review the results.  Testing/Procedures: NONE  Follow-Up: At [facility], you and your health needs are our priority.  As part of our continuing mission to provide you with exceptional heart care, our providers are all part of one team.  This team includes your primary Cardiologist (physician) and Advanced Practice Providers or APPs (Physician Assistants and Nurse Practitioners) who all work together to provide you with the care you need, when you need it.  Your next appointment:   6 month(s)  Provider:   Annabella Scarce, MD, Rosaline Bane, NP, or Reche Finder, NP    PHARM D IN 2 MONTHS   We recommend signing up for the patient portal called MyChart.  Sign up information is provided on this After Visit Summary.  MyChart is used to connect with patients for Virtual Visits (Telemedicine).  Patients are able to view lab/test results, encounter notes, upcoming appointments, etc.  Non-urgent messages can be sent to your provider as well.   To learn more about what you can do with MyChart, go to [email or web address].            Consider Dr. Catheryn Slocumb or Dr. Clotilda Single for primary care.

## 2024-02-04 LAB — LIPID PANEL
Chol/HDL Ratio: 2.9 ratio (ref 0.0–4.4)
Cholesterol, Total: 232 mg/dL — ABNORMAL HIGH (ref 100–199)
HDL: 79 mg/dL (ref >39)
LDL Chol Calc (NIH): 142 mg/dL — ABNORMAL HIGH (ref 0–99)
Triglycerides: 64 mg/dL (ref 0–149)
VLDL Cholesterol Cal: 11 mg/dL (ref 5–40)

## 2024-02-04 LAB — COMPREHENSIVE METABOLIC PANEL WITH GFR
ALT: 11 IU/L (ref 0–32)
AST: 19 IU/L (ref 0–40)
Albumin: 4.6 g/dL (ref 3.8–4.8)
Alkaline Phosphatase: 81 IU/L (ref 49–135)
BUN/Creatinine Ratio: 18 (ref 12–28)
BUN: 15 mg/dL (ref 8–27)
Bilirubin Total: 0.4 mg/dL (ref 0.0–1.2)
CO2: 26 mmol/L (ref 20–29)
Calcium: 9.6 mg/dL (ref 8.7–10.3)
Chloride: 102 mmol/L (ref 96–106)
Creatinine, Ser: 0.85 mg/dL (ref 0.57–1.00)
Globulin, Total: 2.4 g/dL (ref 1.5–4.5)
Glucose: 120 mg/dL — ABNORMAL HIGH (ref 70–99)
Potassium: 4.5 mmol/L (ref 3.5–5.2)
Sodium: 141 mmol/L (ref 134–144)
Total Protein: 7 g/dL (ref 6.0–8.5)
eGFR: 73 mL/min/1.73 (ref >59)

## 2024-02-07 ENCOUNTER — Ambulatory Visit (HOSPITAL_BASED_OUTPATIENT_CLINIC_OR_DEPARTMENT_OTHER): Payer: Self-pay | Admitting: Family

## 2024-03-06 ENCOUNTER — Telehealth: Payer: Self-pay | Admitting: Cardiovascular Disease

## 2024-03-06 DIAGNOSIS — E785 Hyperlipidemia, unspecified: Secondary | ICD-10-CM

## 2024-03-06 MED ORDER — ROSUVASTATIN CALCIUM 20 MG PO TABS: 20.0000 mg | ORAL_TABLET | Freq: Every day | ORAL | 3 refills | Status: AC

## 2024-03-06 NOTE — Telephone Encounter (Signed)
 Returned call to patient.  Reviewed results.   She voices understanding and agreement.  Will restart Crestor  20 mg and return for blood work.  Medication sent to [facility] and lab orders placed and released.  Reqs placed into outgoing mail to patient.

## 2024-03-06 NOTE — Telephone Encounter (Signed)
 Left detailed message per DPR to check mychart or call back to review labs and recommendations.

## 2024-03-06 NOTE — Telephone Encounter (Signed)
Pt returning nurses call regarding results. Please advise 

## 2024-04-03 ENCOUNTER — Encounter: Payer: Self-pay | Admitting: Pharmacist Clinician (PhC)/ Clinical Pharmacy Specialist

## 2024-04-03 ENCOUNTER — Ambulatory Visit: Attending: Cardiology | Admitting: Pharmacist Clinician (PhC)/ Clinical Pharmacy Specialist

## 2024-04-03 ENCOUNTER — Other Ambulatory Visit (HOSPITAL_COMMUNITY): Payer: Self-pay

## 2024-04-03 VITALS — BP 118/72 | HR 84

## 2024-04-03 DIAGNOSIS — I10 Essential (primary) hypertension: Secondary | ICD-10-CM | POA: Diagnosis not present

## 2024-04-03 NOTE — Patient Instructions (Signed)
 Follow up appointment: with Dr. Raford in April (call in January to schedule)  Take your BP meds as follows: continue with losartan /hctz 50/12.5 mg daily  Check your blood pressure at home daily (if able) and keep record of the readings.  Your blood pressure goal is < 130/80  To check your pressure at home you will need to:  1. Sit up in a chair, with feet flat on the floor and back supported. Do not cross your ankles or legs. 2. Rest your left arm so that the cuff is about heart level. If the cuff goes on your upper arm,  then just relax the arm on the table, arm of the chair or your lap. If you have a wrist cuff, we  suggest relaxing your wrist against your chest (think of it as Pledging the Flag with the  wrong arm).  3. Place the cuff snugly around your arm, about 1 inch above the crook of your elbow. The  cords should be inside the groove of your elbow.  4. Sit quietly, with the cuff in place, for about 5 minutes. After that 5 minutes press the power  button to start a reading. 5. Do not talk or move while the reading is taking place.  6. Record your readings on a sheet of paper. Although most cuffs have a memory, it is often  easier to see a pattern developing when the numbers are all in front of you.  7. You can repeat the reading after 1-3 minutes if it is recommended  Make sure your bladder is empty and you have not had caffeine or tobacco within the last 30 min  Always bring your blood pressure log with you to your appointments. If you have not brought your monitor in to be double checked for accuracy, please bring it to your next appointment.  You can find a list of quality blood pressure cuffs at [email or web address]  Important lifestyle changes to control high blood pressure  Intervention  Effect on the BP  Lose extra pounds and watch your waistline Weight loss is one of the most effective lifestyle changes for controlling blood pressure. If you're overweight or obese,  losing even a small amount of weight can help reduce blood pressure. Blood pressure might go down by about 1 millimeter of mercury (mm Hg) with each kilogram (about 2.2 pounds) of weight lost.  Exercise regularly As a general goal, aim for at least 30 minutes of moderate physical activity every day. Regular physical activity can lower high blood pressure by about 5 to 8 mm Hg.  Eat a healthy diet Eating a diet rich in whole grains, fruits, vegetables, and low-fat dairy products and low in saturated fat and cholesterol. A healthy diet can lower high blood pressure by up to 11 mm Hg.  Reduce salt (sodium) in your diet Even a small reduction of sodium in the diet can improve heart health and reduce high blood pressure by about 5 to 6 mm Hg.  Limit alcohol One drink equals 12 ounces of beer, 5 ounces of wine, or 1.5 ounces of 80-proof liquor.  Limiting alcohol to less than one drink a day for women or two drinks a day for men can help lower blood pressure by about 4 mm Hg.   If you have any questions or concerns please use My Chart to send questions or call the office at [phone]

## 2024-04-03 NOTE — Progress Notes (Signed)
 "  Office Visit    Patient Name: Christine Walker Date of Encounter: 04/03/2024  Primary Care Provider:  Antonio Cyndee Jamee JONELLE, DO Primary Cardiologist:  Victory LELON Claudene DOUGLAS, MD (Inactive)  Chief Complaint    Hypertension  Significant Past Medical History   CAD 2017 - CAC 218 (92nd percentile) PCI to LAD, on ASA  HLD 10/25 LDL 142, on rosuvastatin  20 mg daily  DM2 Last noted > 6.5 was 5/21 at 7.2, most recent 2/22 6.4  migraine        Allergies[1]  History of Present Illness    Christine Walker is a 73 y.o. female patient of Dr Raford, in the office today for hypertension evaluation.   She was seen by Dr. Raford in October, with a BP of 154/82.  At the time she was on losartan  50 mg, but wasa also taking multiple supplements, one of which was supposed to help control BP.   Dr. Raford added hydrochlorothiazide  12.5 mg to her losartan  and asked that she follow up in 2 months.  Today she returns for follow up.  Notes that she stopped all of the supplements (with exception of just a few vitamins/minerals), as they didn't seem to be making any difference.  Checks her BP at home occasionally, with the highest recent reading at 133 systolic.    Blood Pressure Goal:  130/80  Current Medications:  losartan  hctz 50/12.5 mg daily  Family Hx: Mom had MI, sister with CABG x 4, another sister with CABG x 2, brother died from heart disease;  Social Hx:      Tobacco: no  Alcohol:no  Caffeine: herbal teas some sweet tea if going out, only some coffee  Diet:   eats more than she should, no regular fast food; does add salt; , variety of meats; vegetables frozen, or fresh greens; snacks regularly on chips, cookies, crackers   Exercise: walk ~5000 steps per day  Home BP readings:  has wrist device, notes very different readings when compares left to right wrist.  (Most cuffs are validated on on left).  Checks occasionally, highest by recall is 133 systolic.    Accessory Clinical  Findings    Lab Results  Component Value Date   CREATININE 0.85 02/03/2024   BUN 15 02/03/2024   NA 141 02/03/2024   K 4.5 02/03/2024   CL 102 02/03/2024   CO2 26 02/03/2024   Lab Results  Component Value Date   ALT 11 02/03/2024   AST 19 02/03/2024   ALKPHOS 81 02/03/2024   BILITOT 0.4 02/03/2024   Lab Results  Component Value Date   HGBA1C 6.4 06/05/2020    Home Medications    Current Outpatient Medications  Medication Sig Dispense Refill   amoxicillin  (AMOXIL ) 500 MG capsule 4 po 1 hour prior to procedure 4 capsule 2   aspirin  EC 81 MG tablet Take 1 tablet (81 mg total) by mouth daily. Swallow whole. 90 tablet 3   cholecalciferol (VITAMIN D3) 25 MCG (1000 UNIT) tablet Take 1,000 Units by mouth daily.     CINNAMON PO Take by mouth.     clindamycin (CLEOCIN T) 1 % lotion Apply 1 Application topically 2 (two) times daily.     clindamycin (CLINDAGEL) 1 % gel Apply 1 Application topically every morning.     Cyanocobalamin  (VITAMIN B-12 PO) Take 1 capsule by mouth daily.     Dapsone 5 % topical gel Apply 1 Application topically in the morning.  hydroquinone 4 % cream Apply 1 application  topically 2 (two) times daily as needed.     losartan -hydrochlorothiazide  (HYZAAR) 50-12.5 MG tablet Take 1 tablet by mouth daily. 90 tablet 3   Menaquinone-7 (VITAMIN K2 PO) Take 1 capsule by mouth daily.     Multiple Vitamins-Minerals (ZINC PO) Take 1 capsule by mouth daily.     nitroGLYCERIN  (NITROSTAT ) 0.4 MG SL tablet Place 1 tablet (0.4 mg total) under the tongue every 5 (five) minutes as needed for chest pain. 25 tablet 3   Omega-3 Fatty Acids (OMEGA 3 PO) Take 1 capsule by mouth daily.     Probiotic Product (FLORA-Q PO) Take 1 capsule by mouth daily. Friendly flora     rosuvastatin  (CRESTOR ) 20 MG tablet Take 1 tablet (20 mg total) by mouth daily. 90 tablet 3   tretinoin (RETIN-A) 0.025 % cream Apply 1 application  topically as needed.     No current facility-administered  medications for this visit.         Assessment & Plan    Essential hypertension Assessment: BP is controlled in office BP 118/72 mmHg;  Tolerates losartan  hctz well, without any side effects Denies SOB, palpitation, chest pain, headaches,or swelling Reiterated the importance of regular exercise and low salt diet   Plan:  Continue taking losartan  hctz 50/12.5 mg once daily Patient to keep record of BP readings with heart rate and report to us  at the next visit Patient to follow up with Dr. Raford in April  Labs ordered today:  none   Allean Mink PharmD CPP [facility]  [address] [place], [place] [address] [phone]       [1] No Known Allergies  "

## 2024-04-03 NOTE — Assessment & Plan Note (Signed)
 Assessment: BP is controlled in office BP 118/72 mmHg;  Tolerates losartan  hctz well, without any side effects Denies SOB, palpitation, chest pain, headaches,or swelling Reiterated the importance of regular exercise and low salt diet   Plan:  Continue taking losartan  hctz 50/12.5 mg once daily Patient to keep record of BP readings with heart rate and report to us  at the next visit Patient to follow up with Dr. Raford in April  Labs ordered today:  none
